# Patient Record
Sex: Male | Born: 1970 | Race: White | Hispanic: No | Marital: Married | Smoking: Current every day smoker
Health system: Southern US, Community
[De-identification: ages and names within clinical notes are randomized; demographics above are authoritative.]

## PROBLEM LIST (undated history)

## (undated) DIAGNOSIS — K219 Gastro-esophageal reflux disease without esophagitis: Secondary | ICD-10-CM

## (undated) DIAGNOSIS — I10 Essential (primary) hypertension: Secondary | ICD-10-CM

## (undated) DIAGNOSIS — T7840XA Allergy, unspecified, initial encounter: Secondary | ICD-10-CM

## (undated) HISTORY — DX: Gastro-esophageal reflux disease without esophagitis: K21.9

## (undated) HISTORY — DX: Allergy, unspecified, initial encounter: T78.40XA

## (undated) HISTORY — PX: ELBOW SURGERY: SHX618

---

## 1996-03-31 HISTORY — PX: HERNIA REPAIR: SHX51

## 2015-08-21 ENCOUNTER — Ambulatory Visit: Payer: Self-pay | Admitting: Family Medicine

## 2015-08-21 ENCOUNTER — Ambulatory Visit
Admission: RE | Admit: 2015-08-21 | Discharge: 2015-08-21 | Disposition: A | Discharge status: Home / Self Care | Payer: 59 | Source: Ambulatory Visit | Attending: Family Medicine | Admitting: Family Medicine

## 2015-08-21 ENCOUNTER — Encounter: Payer: Self-pay | Admitting: Family Medicine

## 2015-08-21 ENCOUNTER — Ambulatory Visit (INDEPENDENT_AMBULATORY_CARE_PROVIDER_SITE_OTHER): Payer: 59 | Admitting: Family Medicine

## 2015-08-21 VITALS — BP 140/102 | HR 104 | Ht 63.0 in | Wt 181.0 lb

## 2015-08-21 DIAGNOSIS — K625 Hemorrhage of anus and rectum: Secondary | ICD-10-CM

## 2015-08-21 DIAGNOSIS — M5090 Cervical disc disorder, unspecified, unspecified cervical region: Secondary | ICD-10-CM

## 2015-08-21 DIAGNOSIS — R030 Elevated blood-pressure reading, without diagnosis of hypertension: Secondary | ICD-10-CM | POA: Diagnosis not present

## 2015-08-21 DIAGNOSIS — M50321 Other cervical disc degeneration at C4-C5 level: Secondary | ICD-10-CM | POA: Diagnosis not present

## 2015-08-21 DIAGNOSIS — M47892 Other spondylosis, cervical region: Secondary | ICD-10-CM | POA: Insufficient documentation

## 2015-08-21 DIAGNOSIS — IMO0001 Reserved for inherently not codable concepts without codable children: Secondary | ICD-10-CM

## 2015-08-21 LAB — HEMOCCULT GUIAC POC 1CARD (OFFICE): FECAL OCCULT BLD: NEGATIVE

## 2015-08-21 MED ORDER — MELOXICAM 15 MG PO TABS: 15.0000 mg | ORAL_TABLET | Freq: Every day | ORAL | Status: DC

## 2015-08-21 MED ORDER — CYCLOBENZAPRINE HCL 10 MG PO TABS: 10.0000 mg | ORAL_TABLET | Freq: Three times a day (TID) | ORAL | Status: DC | PRN

## 2015-08-21 MED ORDER — PREDNISONE 10 MG PO TABS: 10.0000 mg | ORAL_TABLET | Freq: Every day | ORAL | Status: DC

## 2015-08-21 NOTE — Patient Instructions (Signed)

## 2015-08-21 NOTE — Progress Notes (Signed)
Name: Jesse Moody   MRN: [id number]    DOB: 04-Aug-1970   Date:08/21/2015       Progress Note  Subjective  Chief Complaint  Chief Complaint  Patient presents with  . Establish Care  . Neck Pain    neck pain when tries to move in any direction, shoulder pain and elbow pain  . Blood In Stools    visible blood in stools and on toilet paper x 3 weeks    Neck Pain  This is a new problem. The current episode started in the past 7 days. The problem occurs constantly. The problem has been rapidly worsening. The pain is present in the left side. The quality of the pain is described as aching. The pain is at a severity of 5/10. The pain is moderate. The symptoms are aggravated by twisting (rotating neck). Stiffness is present in the morning. Pertinent negatives include no chest pain, fever, headaches, numbness, paresis, tingling, weakness or weight loss. He has tried Public house [employer] ("health medicine pills") for the symptoms. The treatment provided mild relief.  Rectal Bleeding  The current episode started more than 2 weeks ago. The problem occurs occasionally. The problem has been unchanged. The patient is experiencing no pain. Pertinent negatives include no fever, no abdominal pain, no diarrhea, no hematemesis, no hemorrhoids, no nausea, no rectal pain, no hematuria, no chest pain, no headaches, no coughing and no rash.    No problem-specific assessment & plan notes found for this encounter.   Past Medical History  Diagnosis Date  . GERD (gastroesophageal reflux disease)   . Allergy     Past Surgical History  Procedure Laterality Date  . Elbow surgery    . Hernia repair  1998    Family History  Problem Relation Age of Onset  . Cancer Maternal Grandmother     Social History   Social History  . Marital Status: Married    Spouse Name: N/A  . Number of Children: N/A  . Years of Education: N/A   Occupational History  . Not on file.   Social History Main Topics  .  Smoking status: Current Every Day Smoker  . Smokeless tobacco: Not on file  . Alcohol Use: No  . Drug Use: No  . Sexual Activity: Yes   Other Topics Concern  . Not on file   Social History Narrative  . No narrative on file    Allergies  Allergen Reactions  . Penicillins      Review of Systems  Constitutional: Negative for fever, chills, weight loss and malaise/fatigue.  HENT: Negative for ear discharge, ear pain and sore throat.   Eyes: Negative for blurred vision.  Respiratory: Negative for cough, sputum production, shortness of breath and wheezing.   Cardiovascular: Negative for chest pain, palpitations and leg swelling.  Gastrointestinal: Positive for hematochezia. Negative for heartburn, nausea, abdominal pain, diarrhea, constipation, blood in stool, melena, rectal pain, hematemesis and hemorrhoids.  Genitourinary: Negative for dysuria, urgency, frequency and hematuria.  Musculoskeletal: Positive for neck pain. Negative for myalgias, back pain and joint pain.  Skin: Negative for rash.  Neurological: Negative for dizziness, tingling, sensory change, focal weakness, weakness, numbness and headaches.  Endo/Heme/Allergies: Negative for environmental allergies and polydipsia. Does not bruise/bleed easily.  Psychiatric/Behavioral: Negative for depression and suicidal ideas. The patient is not nervous/anxious and does not have insomnia.      Objective  Filed Vitals:   08/21/15 1505  BP: 140/102  Pulse: 104  Height:  (1.6 m)  Weight: 181 lb (82.101 kg)    Physical Exam  Constitutional: He is oriented to person, place, and time and well-developed, well-nourished, and in no distress.  HENT:  Head: Normocephalic.  Right Ear: External ear normal.  Left Ear: External ear normal.  Nose: Nose normal.  Mouth/Throat: Oropharynx is clear and moist.  Eyes: Conjunctivae and EOM are normal. Pupils are equal, round, and reactive to light. Right eye exhibits no discharge. Left  eye exhibits no discharge. No scleral icterus.  Neck: Normal range of motion. Neck supple. No JVD present. No tracheal deviation present. No thyromegaly present.  Cardiovascular: Normal rate, regular rhythm, normal heart sounds and intact distal pulses.  Exam reveals no gallop and no friction rub.   No murmur heard. Pulmonary/Chest: Breath sounds normal. No respiratory distress. He has no wheezes. He has no rales.  Abdominal: Soft. Bowel sounds are normal. He exhibits no mass. There is no hepatosplenomegaly. There is no tenderness. There is no rebound, no guarding and no CVA tenderness.  Genitourinary: Rectum normal and prostate normal. Rectal exam shows no external hemorrhoid, no fissure and no mass. Prostate is not enlarged and not tender.  Musculoskeletal: Normal range of motion. He exhibits no edema.       Cervical back: He exhibits bony tenderness and spasm. He exhibits no tenderness and no swelling.  Lymphadenopathy:    He has no cervical adenopathy.  Neurological: He is alert and oriented to person, place, and time. He has normal sensation, normal strength, normal reflexes and intact cranial nerves. No cranial nerve deficit.  Skin: Skin is warm. No rash noted.  Psychiatric: Mood and affect normal.  Nursing note and vitals reviewed.     Assessment & Plan  Problem List Items Addressed This Visit    None    Visit Diagnoses    Cervical disc disease    -  Primary    Relevant Medications    meloxicam (MOBIC) 15 MG tablet    cyclobenzaprine (FLEXERIL) 10 MG tablet    predniSONE (DELTASONE) 10 MG tablet    Other Relevant Orders    DG Cervical Spine Complete    Renal Function Panel    Rectal bleed        Relevant Orders    POCT Occult Blood Stool (Completed)    Hemoglobin    Elevated blood pressure             Dr. Hayden Rasmusseneanna Jones [facility]  08/21/2015

## 2015-08-22 LAB — RENAL FUNCTION PANEL
Albumin: 4.7 g/dL (ref 3.5–5.5)
BUN / CREAT RATIO: 14 (ref 9–20)
BUN: 15 mg/dL (ref 6–24)
CALCIUM: 9.8 mg/dL (ref 8.7–10.2)
CO2: 25 mmol/L (ref 18–29)
CREATININE: 1.04 mg/dL (ref 0.76–1.27)
Chloride: 102 mmol/L (ref 96–106)
GFR, EST AFRICAN AMERICAN: 100 mL/min/{1.73_m2} (ref >59)
GFR, EST NON AFRICAN AMERICAN: 86 mL/min/{1.73_m2} (ref >59)
Glucose: 65 mg/dL (ref 65–99)
Phosphorus: 3.6 mg/dL (ref 2.5–4.5)
Potassium: 4.4 mmol/L (ref 3.5–5.2)
SODIUM: 144 mmol/L (ref 134–144)

## 2015-08-22 LAB — HEMOGLOBIN: HEMOGLOBIN: 16 g/dL (ref 12.6–17.7)

## 2015-09-04 ENCOUNTER — Ambulatory Visit: Payer: 59 | Admitting: Family Medicine

## 2015-09-07 ENCOUNTER — Ambulatory Visit (INDEPENDENT_AMBULATORY_CARE_PROVIDER_SITE_OTHER): Payer: 59 | Admitting: Family Medicine

## 2015-09-07 ENCOUNTER — Encounter: Payer: Self-pay | Admitting: Family Medicine

## 2015-09-07 VITALS — BP 150/110 | HR 80 | Ht 63.0 in | Wt 179.0 lb

## 2015-09-07 DIAGNOSIS — M5090 Cervical disc disorder, unspecified, unspecified cervical region: Secondary | ICD-10-CM

## 2015-09-07 DIAGNOSIS — M5030 Other cervical disc degeneration, unspecified cervical region: Secondary | ICD-10-CM

## 2015-09-07 DIAGNOSIS — I10 Essential (primary) hypertension: Secondary | ICD-10-CM

## 2015-09-07 MED ORDER — MELOXICAM 15 MG PO TABS: 15.0000 mg | ORAL_TABLET | Freq: Every day | ORAL | Status: DC

## 2015-09-07 MED ORDER — TRAMADOL HCL 50 MG PO TABS: 50.0000 mg | ORAL_TABLET | Freq: Three times a day (TID) | ORAL | Status: DC | PRN

## 2015-09-07 MED ORDER — LISINOPRIL-HYDROCHLOROTHIAZIDE 10-12.5 MG PO TABS: 1.0000 | ORAL_TABLET | Freq: Every day | ORAL | Status: DC

## 2015-09-07 MED ORDER — CYCLOBENZAPRINE HCL 10 MG PO TABS: 10.0000 mg | ORAL_TABLET | Freq: Three times a day (TID) | ORAL | Status: DC | PRN

## 2015-09-07 NOTE — Patient Instructions (Signed)

## 2015-09-07 NOTE — Progress Notes (Signed)
Name: Jesse Moody   MRN: [id number]    DOB: 01-Jul-1970   Date:09/07/2015       Progress Note  Subjective  Chief Complaint  Chief Complaint  Patient presents with  . Follow-up    neck pain- taking prednisone and meloxicam- seems to "help some"    Neck Pain  This is a new problem. The current episode started more than 1 month ago. The problem occurs daily. The problem has been gradually improving. The pain is associated with nothing. The pain is present in the left side. The quality of the pain is described as aching. The pain is at a severity of 7/10. The pain is moderate. The pain is same all the time. Pertinent negatives include no chest pain, fever, headaches, leg pain, numbness, pain with swallowing, paresis, photophobia, tingling, weakness or weight loss.  Hypertension This is a new problem. The current episode started more than 1 month ago. The problem is unchanged. The problem is uncontrolled. Associated symptoms include neck pain. Pertinent negatives include no blurred vision, chest pain, headaches, malaise/fatigue, palpitations or shortness of breath. Past treatments include nothing. The current treatment provides moderate improvement. There is no history of angina, kidney disease, CAD/MI, CVA, heart failure, left ventricular hypertrophy, PVD, renovascular disease or retinopathy. There is no history of chronic renal disease or a hypertension causing med.    No problem-specific assessment & plan notes found for this encounter.   Past Medical History  Diagnosis Date  . GERD (gastroesophageal reflux disease)   . Allergy     Past Surgical History  Procedure Laterality Date  . Elbow surgery    . Hernia repair  1998    Family History  Problem Relation Age of Onset  . Cancer Maternal Grandmother     Social History   Social History  . Marital Status: Married    Spouse Name: N/A  . Number of Children: N/A  . Years of Education: N/A   Occupational History  . Not on file.    Social History Main Topics  . Smoking status: Current Every Day Smoker  . Smokeless tobacco: Not on file  . Alcohol Use: No  . Drug Use: No  . Sexual Activity: Yes   Other Topics Concern  . Not on file   Social History Narrative    Allergies  Allergen Reactions  . Penicillins      Review of Systems  Constitutional: Negative for fever, chills, weight loss and malaise/fatigue.  HENT: Negative for ear discharge, ear pain and sore throat.   Eyes: Negative for blurred vision and photophobia.  Respiratory: Negative for cough, sputum production, shortness of breath and wheezing.   Cardiovascular: Negative for chest pain, palpitations and leg swelling.  Gastrointestinal: Negative for heartburn, nausea, abdominal pain, diarrhea, constipation, blood in stool and melena.  Genitourinary: Negative for dysuria, urgency, frequency and hematuria.  Musculoskeletal: Positive for neck pain. Negative for myalgias, back pain and joint pain.  Skin: Negative for rash.  Neurological: Negative for dizziness, tingling, sensory change, focal weakness, weakness, numbness and headaches.  Endo/Heme/Allergies: Negative for environmental allergies and polydipsia. Does not bruise/bleed easily.  Psychiatric/Behavioral: Negative for depression and suicidal ideas. The patient is not nervous/anxious and does not have insomnia.      Objective  Filed Vitals:   09/07/15 1514  BP: 150/110  Pulse: 80  Height:  (1.6 m)  Weight: 179 lb (81.194 kg)    Physical Exam  Constitutional: He is oriented to person, place, and time and  well-developed, well-nourished, and in no distress.  HENT:  Head: Normocephalic.  Right Ear: External ear normal.  Left Ear: External ear normal.  Nose: Nose normal.  Mouth/Throat: Oropharynx is clear and moist.  Eyes: Conjunctivae and EOM are normal. Pupils are equal, round, and reactive to light. Right eye exhibits no discharge. Left eye exhibits no discharge. No scleral  icterus.  Neck: Normal range of motion. Neck supple. No JVD present. No tracheal deviation present. No thyromegaly present.  Cardiovascular: Normal rate, regular rhythm, normal heart sounds and intact distal pulses.  Exam reveals no gallop and no friction rub.   No murmur heard. Pulmonary/Chest: Breath sounds normal. No respiratory distress. He has no wheezes. He has no rales.  Abdominal: Soft. Bowel sounds are normal. He exhibits no mass. There is no hepatosplenomegaly. There is no tenderness. There is no rebound, no guarding and no CVA tenderness.  Musculoskeletal: Normal range of motion. He exhibits no edema or tenderness.  Lymphadenopathy:    He has no cervical adenopathy.  Neurological: He is alert and oriented to person, place, and time. He has normal sensation, normal strength, normal reflexes and intact cranial nerves. No cranial nerve deficit.  Skin: Skin is warm. No rash noted.  Psychiatric: Mood and affect normal.  Nursing note and vitals reviewed.     Assessment & Plan  Problem List Items Addressed This Visit      Cardiovascular and Mediastinum   Essential hypertension   Relevant Medications   lisinopril-hydrochlorothiazide (PRINZIDE,ZESTORETIC) 10-12.5 MG tablet     Musculoskeletal and Integument   Degenerative cervical disc - Primary   Relevant Medications   traMADol (ULTRAM) 50 MG tablet   cyclobenzaprine (FLEXERIL) 10 MG tablet   meloxicam (MOBIC) 15 MG tablet    Other Visit Diagnoses    Cervical disc disease        Relevant Medications    traMADol (ULTRAM) 50 MG tablet    cyclobenzaprine (FLEXERIL) 10 MG tablet    meloxicam (MOBIC) 15 MG tablet         Dr. Hayden Rasmusseneanna Coreen [facility]  09/07/2015

## 2015-10-05 ENCOUNTER — Encounter: Payer: Self-pay | Admitting: Family Medicine

## 2015-10-05 ENCOUNTER — Other Ambulatory Visit
Admission: RE | Admit: 2015-10-05 | Discharge: 2015-10-05 | Disposition: A | Discharge status: Home / Self Care | Payer: 59 | Source: Ambulatory Visit | Attending: Family Medicine | Admitting: Family Medicine

## 2015-10-05 ENCOUNTER — Ambulatory Visit (INDEPENDENT_AMBULATORY_CARE_PROVIDER_SITE_OTHER): Payer: 59 | Admitting: Family Medicine

## 2015-10-05 VITALS — BP 130/88 | HR 84 | Ht 63.0 in | Wt 181.0 lb

## 2015-10-05 DIAGNOSIS — I10 Essential (primary) hypertension: Secondary | ICD-10-CM

## 2015-10-05 DIAGNOSIS — M5030 Other cervical disc degeneration, unspecified cervical region: Secondary | ICD-10-CM

## 2015-10-05 DIAGNOSIS — M5090 Cervical disc disorder, unspecified, unspecified cervical region: Secondary | ICD-10-CM

## 2015-10-05 LAB — BASIC METABOLIC PANEL
Anion gap: 6 (ref 5–15)
BUN: 22 mg/dL — ABNORMAL HIGH (ref 6–20)
CALCIUM: 9.7 mg/dL (ref 8.9–10.3)
CO2: 29 mmol/L (ref 22–32)
Chloride: 100 mmol/L — ABNORMAL LOW (ref 101–111)
Creatinine, Ser: 1.11 mg/dL (ref 0.61–1.24)
Glucose, Bld: 91 mg/dL (ref 65–99)
Potassium: 4.3 mmol/L (ref 3.5–5.1)
SODIUM: 135 mmol/L (ref 135–145)

## 2015-10-05 MED ORDER — CYCLOBENZAPRINE HCL 10 MG PO TABS: 10.0000 mg | ORAL_TABLET | Freq: Every day | ORAL | Status: DC

## 2015-10-05 MED ORDER — TRAMADOL HCL 50 MG PO TABS: 50.0000 mg | ORAL_TABLET | Freq: Two times a day (BID) | ORAL | Status: DC

## 2015-10-05 MED ORDER — LISINOPRIL-HYDROCHLOROTHIAZIDE 10-12.5 MG PO TABS: 1.0000 | ORAL_TABLET | Freq: Every day | ORAL | Status: DC

## 2015-10-05 NOTE — Progress Notes (Signed)
Name: Jesse Moody   MRN: [id number]    DOB: 05/12/1970   Date:10/05/2015       Progress Note  Subjective  Chief Complaint  Chief Complaint  Patient presents with  . Neck Pain    referral to ortho to discuss injection    Neck Pain  This is a chronic problem. The current episode started more than 1 year ago. The problem occurs constantly. The problem has been waxing and waning. The pain is associated with nothing. The pain is present in the left side. The pain is at a severity of 5/10. The pain is moderate. Pertinent negatives include no chest pain, fever, headaches, numbness, paresis, tingling, weakness or weight loss. He has tried oral narcotics for the symptoms. The treatment provided moderate relief.    No problem-specific assessment & plan notes found for this encounter.   Past Medical History  Diagnosis Date  . GERD (gastroesophageal reflux disease)   . Allergy     Past Surgical History  Procedure Laterality Date  . Elbow surgery    . Hernia repair  1998    Family History  Problem Relation Age of Onset  . Cancer Maternal Grandmother     Social History   Social History  . Marital Status: Married    Spouse Name: N/A  . Number of Children: N/A  . Years of Education: N/A   Occupational History  . Not on file.   Social History Main Topics  . Smoking status: Current Every Day Smoker  . Smokeless tobacco: Not on file  . Alcohol Use: No  . Drug Use: No  . Sexual Activity: Yes   Other Topics Concern  . Not on file   Social History Narrative    Allergies  Allergen Reactions  . Penicillins      Review of Systems  Constitutional: Negative for fever, chills, weight loss and malaise/fatigue.  HENT: Negative for ear discharge, ear pain and sore throat.   Eyes: Negative for blurred vision.  Respiratory: Negative for cough, sputum production, shortness of breath and wheezing.   Cardiovascular: Negative for chest pain, palpitations and leg swelling.   Gastrointestinal: Negative for heartburn, nausea, abdominal pain, diarrhea, constipation, blood in stool and melena.  Genitourinary: Negative for dysuria, urgency, frequency and hematuria.  Musculoskeletal: Positive for neck pain. Negative for myalgias, back pain and joint pain.  Skin: Negative for rash.  Neurological: Negative for dizziness, tingling, sensory change, focal weakness, weakness, numbness and headaches.  Endo/Heme/Allergies: Negative for environmental allergies and polydipsia. Does not bruise/bleed easily.  Psychiatric/Behavioral: Negative for depression and suicidal ideas. The patient is not nervous/anxious and does not have insomnia.      Objective  Filed Vitals:   10/05/15 1535  BP: 130/88  Pulse: 84  Height: 5\' 3"  (1.6 m)  Weight: 181 lb (82.101 kg)    Physical Exam  Constitutional: He is oriented to person, place, and time and well-developed, well-nourished, and in no distress.  HENT:  Head: Normocephalic.  Right Ear: External ear normal.  Left Ear: External ear normal.  Nose: Nose normal.  Mouth/Throat: Oropharynx is clear and moist.  Eyes: Conjunctivae and EOM are normal. Pupils are equal, round, and reactive to light. Right eye exhibits no discharge. Left eye exhibits no discharge. No scleral icterus.  Neck: Normal range of motion. Neck supple. No JVD present. No tracheal deviation present. No thyromegaly present.  Cardiovascular: Normal rate, regular rhythm, normal heart sounds and intact distal pulses.  Exam reveals no gallop and no  friction rub.   No murmur heard. Pulmonary/Chest: Breath sounds normal. No respiratory distress. He has no wheezes. He has no rales.  Abdominal: Soft. Bowel sounds are normal. He exhibits no mass. There is no hepatosplenomegaly. There is no tenderness. There is no rebound, no guarding and no CVA tenderness.  Musculoskeletal: Normal range of motion. He exhibits no edema or tenderness.       Cervical back: He exhibits spasm.   Lymphadenopathy:    He has no cervical adenopathy.  Neurological: He is alert and oriented to person, place, and time. He has normal sensation, normal strength and normal reflexes. No cranial nerve deficit.  Skin: Skin is warm. No rash noted.  Psychiatric: Mood and affect normal.  Nursing note and vitals reviewed.     Assessment & Plan  Problem List Items Addressed This Visit      Cardiovascular and Mediastinum   Essential hypertension - Primary   Relevant Medications   lisinopril-hydrochlorothiazide (PRINZIDE,ZESTORETIC) 10-12.5 MG tablet   Other Relevant Orders   Basic metabolic panel     Musculoskeletal and Integument   Degenerative cervical disc   Relevant Medications   traMADol (ULTRAM) 50 MG tablet   cyclobenzaprine (FLEXERIL) 10 MG tablet   Other Relevant Orders   Ambulatory referral to Orthopedic Surgery    Other Visit Diagnoses    Cervical disc disease        Relevant Medications    traMADol (ULTRAM) 50 MG tablet    cyclobenzaprine (FLEXERIL) 10 MG tablet    Other Relevant Orders    Ambulatory referral to Orthopedic Surgery         Dr. Elizabeth Sauereanna Kasee [facility]antz [facility]  10/05/2015

## 2015-11-26 ENCOUNTER — Other Ambulatory Visit: Payer: Self-pay | Admitting: Family Medicine

## 2015-11-26 DIAGNOSIS — M5090 Cervical disc disorder, unspecified, unspecified cervical region: Secondary | ICD-10-CM

## 2016-04-11 ENCOUNTER — Encounter: Payer: Self-pay | Admitting: Family Medicine

## 2016-04-11 ENCOUNTER — Ambulatory Visit (INDEPENDENT_AMBULATORY_CARE_PROVIDER_SITE_OTHER): Payer: 59 | Admitting: Family Medicine

## 2016-04-11 VITALS — BP 120/82 | HR 80 | Ht 63.0 in | Wt 185.0 lb

## 2016-04-11 DIAGNOSIS — M5030 Other cervical disc degeneration, unspecified cervical region: Secondary | ICD-10-CM

## 2016-04-11 DIAGNOSIS — I10 Essential (primary) hypertension: Secondary | ICD-10-CM

## 2016-04-11 DIAGNOSIS — Z23 Encounter for immunization: Secondary | ICD-10-CM | POA: Diagnosis not present

## 2016-04-11 MED ORDER — LISINOPRIL-HYDROCHLOROTHIAZIDE 10-12.5 MG PO TABS: 1.0000 | ORAL_TABLET | Freq: Every day | ORAL | 3 refills | Status: DC

## 2016-04-11 NOTE — Progress Notes (Signed)
Name: Jesse Moody   MRN: [id number]    DOB: 27-Jan-1971   Date:04/11/2016       Progress Note  Subjective  Chief Complaint  Chief Complaint  Patient presents with  . Hypertension    Hypertension  This is a chronic problem. The current episode started more than 1 year ago. The problem has been gradually improving since onset. The problem is controlled. Pertinent negatives include no anxiety, blurred vision, chest pain, headaches, malaise/fatigue, neck pain, orthopnea, palpitations, peripheral edema, PND, shortness of breath or sweats. There are no associated agents to hypertension. There are no known risk factors for coronary artery disease. Past treatments include ACE inhibitors and diuretics. The current treatment provides moderate improvement. There are no compliance problems.  There is no history of angina, kidney disease, CAD/MI, CVA, heart failure, left ventricular hypertrophy, PVD, renovascular disease or retinopathy. There is no history of chronic renal disease or a hypertension causing med.    No problem-specific Assessment & Plan notes found for this encounter.   Past Medical History:  Diagnosis Date  . Allergy   . GERD (gastroesophageal reflux disease)     Past Surgical History:  Procedure Laterality Date  . ELBOW SURGERY    . HERNIA REPAIR  1998    Family History  Problem Relation Age of Onset  . Cancer Maternal Grandmother     Social History   Social History  . Marital status: Married    Spouse name: N/A  . Number of children: N/A  . Years of education: N/A   Occupational History  . Not on file.   Social History Main Topics  . Smoking status: Current Every Day Smoker  . Smokeless tobacco: Not on file  . Alcohol use No  . Drug use: No  . Sexual activity: Yes   Other Topics Concern  . Not on file   Social History Narrative  . No narrative on file    Allergies  Allergen Reactions  . Penicillins      Review of Systems  Constitutional: Negative  for chills, fever, malaise/fatigue and weight loss.  HENT: Negative for ear discharge, ear pain and sore throat.   Eyes: Negative for blurred vision.  Respiratory: Negative for cough, sputum production, shortness of breath and wheezing.   Cardiovascular: Negative for chest pain, palpitations, orthopnea, leg swelling and PND.  Gastrointestinal: Negative for abdominal pain, blood in stool, constipation, diarrhea, heartburn, melena and nausea.  Genitourinary: Negative for dysuria, frequency, hematuria and urgency.  Musculoskeletal: Negative for back pain, joint pain, myalgias and neck pain.  Skin: Negative for rash.  Neurological: Negative for dizziness, tingling, sensory change, focal weakness and headaches.  Endo/Heme/Allergies: Negative for environmental allergies and polydipsia. Does not bruise/bleed easily.  Psychiatric/Behavioral: Negative for depression and suicidal ideas. The patient is not nervous/anxious and does not have insomnia.      Objective  Vitals:   04/11/16 1533  BP: 120/82  Pulse: 80  Weight: 185 lb (83.9 kg)  Height: 5\' 3"  (1.6 m)    Physical Exam  Constitutional: He is oriented to person, place, and time and well-developed, well-nourished, and in no distress.  HENT:  Head: Normocephalic.  Right Ear: External ear normal.  Left Ear: External ear normal.  Nose: Nose normal.  Mouth/Throat: Oropharynx is clear and moist.  Eyes: Conjunctivae and EOM are normal. Pupils are equal, round, and reactive to light. Right eye exhibits no discharge. Left eye exhibits no discharge. No scleral icterus.  Neck: Normal range of motion. Neck  supple. No JVD present. No tracheal deviation present. No thyromegaly present.  Cardiovascular: Normal rate, regular rhythm, normal heart sounds and intact distal pulses.  Exam reveals no gallop and no friction rub.   No murmur heard. Pulmonary/Chest: Breath sounds normal. No respiratory distress. He has no wheezes. He has no rales.   Abdominal: Soft. Bowel sounds are normal. He exhibits no mass. There is no hepatosplenomegaly. There is no tenderness. There is no rebound, no guarding and no CVA tenderness.  Musculoskeletal: Normal range of motion. He exhibits no edema or tenderness.  Lymphadenopathy:    He has no cervical adenopathy.  Neurological: He is alert and oriented to person, place, and time. He has normal sensation, normal strength, normal reflexes and intact cranial nerves. No cranial nerve deficit.  Skin: Skin is warm. No rash noted.  Psychiatric: Mood and affect normal.  Nursing note and vitals reviewed.     Assessment & Plan  Problem List Items Addressed This Visit      Cardiovascular and Mediastinum   Essential hypertension - Primary   Relevant Medications   lisinopril-hydrochlorothiazide (PRINZIDE,ZESTORETIC) 10-12.5 MG tablet     Musculoskeletal and Integument   Degenerative cervical disc   Relevant Orders   Ambulatory referral to Orthopedic Surgery    Other Visit Diagnoses    Immunization due       Relevant Orders   Flu Vaccine QUAD 36+ mos PF IM (Fluarix & Fluzone Quad PF) (Completed)    called ortho and left message that pt wants to come back to discuss further treatment/ injection vs Dr Yves Dill    Dr. Hayden [facility]  04/11/16

## 2016-04-15 ENCOUNTER — Other Ambulatory Visit: Payer: Self-pay

## 2016-04-18 ENCOUNTER — Ambulatory Visit
Admission: EM | Admit: 2016-04-18 | Discharge: 2016-04-18 | Disposition: A | Discharge status: Home / Self Care | Payer: 59 | Attending: Family Medicine | Admitting: Family Medicine

## 2016-04-18 ENCOUNTER — Encounter: Payer: Self-pay | Admitting: *Deleted

## 2016-04-18 DIAGNOSIS — Z024 Encounter for examination for driving license: Secondary | ICD-10-CM

## 2016-04-18 HISTORY — DX: Essential (primary) hypertension: I10

## 2016-04-18 LAB — DEPT OF TRANSP DIPSTICK, URINE (ARMC ONLY)
BILIRUBIN URINE: NEGATIVE
Glucose, UA: NEGATIVE mg/dL
Ketones, ur: NEGATIVE mg/dL
LEUKOCYTES UA: NEGATIVE
NITRITE: NEGATIVE
PH: 6.5 (ref 5.0–8.0)
PROTEIN: NEGATIVE mg/dL
SPECIFIC GRAVITY, URINE: 1.025 (ref 1.005–1.030)

## 2016-04-18 NOTE — ED Triage Notes (Signed)
Commercial Drivers License physical exam 

## 2016-04-18 NOTE — ED Provider Notes (Signed)
CSN: [id number]     Arrival date & time 04/18/16  1635 History   First MD Initiated Contact with Patient 04/18/16 1806     Chief Complaint  Patient presents with  . Commercial Driver's License Exam   (Consider location/radiation/quality/duration/timing/severity/associated sxs/prior Treatment) HPI  He presents for a DOT physical. He has a history of hypertension currently taking lisinopril hydrochlorothiazide. Past surgical history includes an elbow arthroscopy and herniorrhaphy       Past Medical History:  Diagnosis Date  . Allergy   . GERD (gastroesophageal reflux disease)   . Hypertension    Past Surgical History:  Procedure Laterality Date  . ELBOW SURGERY    . HERNIA REPAIR  1998   Family History  Problem Relation Age of Onset  . Cancer Maternal Grandmother    Social History  Substance Use Topics  . Smoking status: Current Every Day Smoker  . Smokeless tobacco: Former NeurosurgeonUser  . Alcohol use No    Review of Systems  All other systems reviewed and are negative.   Allergies  Penicillins  Home Medications   Prior to Admission medications   Medication Sig Start Date End Date Taking? Authorizing Provider  acetaminophen (TYLENOL) 325 MG tablet Take 650 mg by mouth every 6 (six) hours as needed.   Yes Historical Provider, MD  Famotidine-Ca Carb-Mag Hydrox (ACID REDUCER + ANTACID PO) Take 1 capsule by mouth 2 (two) times daily.   Yes Historical Provider, MD  lisinopril-hydrochlorothiazide (PRINZIDE,ZESTORETIC) 10-12.5 MG tablet Take 1 tablet by mouth daily. 04/11/16  Yes Duanne Limerickeanna C Jones, MD   Meds Ordered and Administered this Visit  Medications - No data to display  BP (!) 146/96 (BP Location: Left Arm)   Pulse 91   Temp 98.2 F (36.8 C) (Oral)   Resp 16   Ht 5\' 6"  (1.676 m)   Wt 186 lb (84.4 kg)   BMI 30.02 kg/m  No data found.   Physical Exam  Constitutional:  Referred to DOT physical sheet  Nursing note and vitals reviewed.   Urgent Care Course      Procedures (including critical care time)  Labs Review Labs Reviewed  DEPT OF TRANSP DIPSTICK, URINE([facility] ONLY) - Abnormal; Notable for the following:       Result Value   Hgb urine dipstick TRACE (*)    All other components within normal limits    Imaging Review No results found.   Visual Acuity Review  Right Eye Distance:   Left Eye Distance:   Bilateral Distance:    Right Eye Near:   Left Eye Near:    Bilateral Near:         MDM   1. Driver's permit PE (physical examination)    Patient's blood pressure was not acceptable on 2 separate occasions. I will issue a 3 month certificate that will allow him to have his primary care physician correct his blood pressure to the acceptable levels. If it is not within except the levels at that time then he will fail until his pressure falls below 140/90.  If His pressure does normalize then his certificate will be from today's date and not the time that he returns    Lutricia FeilWilliam P Sonam Huelsmann, PA-C 04/18/16 1851

## 2016-05-12 ENCOUNTER — Other Ambulatory Visit: Payer: Self-pay

## 2016-05-16 ENCOUNTER — Ambulatory Visit: Payer: 59 | Admitting: Family Medicine

## 2016-05-23 ENCOUNTER — Ambulatory Visit: Payer: 59 | Admitting: Family Medicine

## 2016-05-29 ENCOUNTER — Encounter: Payer: Self-pay | Admitting: Family Medicine

## 2016-05-29 ENCOUNTER — Ambulatory Visit (INDEPENDENT_AMBULATORY_CARE_PROVIDER_SITE_OTHER): Payer: 59 | Admitting: Family Medicine

## 2016-05-29 VITALS — BP 120/62 | HR 88 | Ht 66.0 in | Wt 179.0 lb

## 2016-05-29 DIAGNOSIS — I10 Essential (primary) hypertension: Secondary | ICD-10-CM | POA: Diagnosis not present

## 2016-05-29 MED ORDER — LISINOPRIL-HYDROCHLOROTHIAZIDE 20-25 MG PO TABS: 1.0000 | ORAL_TABLET | Freq: Every day | ORAL | 1 refills | Status: DC

## 2016-05-29 NOTE — Progress Notes (Signed)
Name: Jesse Moody   MRN: [id number]    DOB: [birth year]   Date:05/29/2016       Progress Note  Subjective  Chief Complaint  Chief Complaint  Patient presents with  . Hypertension    taking 20/25mg  x 10 days- needs a refill called in for that med    Hypertension  This is a new problem. The current episode started more than 1 month ago. The problem has been rapidly improving since onset. The problem is controlled. Pertinent negatives include no anxiety, blurred vision, chest pain, headaches, malaise/fatigue, neck pain, orthopnea, palpitations, peripheral edema, PND, shortness of breath or sweats. There are no associated agents to hypertension. There are no known risk factors for coronary artery disease. Past treatments include ACE inhibitors and diuretics. The current treatment provides moderate improvement. There are no compliance problems.  There is no history of angina, kidney disease, CAD/MI, CVA, heart failure, left ventricular hypertrophy, PVD, renovascular disease or retinopathy. There is no history of chronic renal disease or a hypertension causing med.    No problem-specific Assessment & Plan notes found for this encounter.   Past Medical History:  Diagnosis Date  . Allergy   . GERD (gastroesophageal reflux disease)   . Hypertension     Past Surgical History:  Procedure Laterality Date  . ELBOW SURGERY    . HERNIA REPAIR  1998    Family History  Problem Relation Age of Onset  . Cancer Maternal Grandmother     Social History   Social History  . Marital status: Married    Spouse name: N/A  . Number of children: N/A  . Years of education: N/A   Occupational History  . Not on file.   Social History Main Topics  . Smoking status: Current Every Day Smoker  . Smokeless tobacco: Former NeurosurgeonUser  . Alcohol use No  . Drug use: No  . Sexual activity: Yes   Other Topics Concern  . Not on file   Social History Narrative  . No narrative on file    Allergies  Allergen  Reactions  . Penicillins Hives    Outpatient Medications Prior to Visit  Medication Sig Dispense Refill  . acetaminophen (TYLENOL) 325 MG tablet Take 650 mg by mouth every 6 (six) hours as needed.    . Famotidine-Ca Carb-Mag Hydrox (ACID REDUCER + ANTACID PO) Take 1 capsule by mouth 2 (two) times daily.    Marland Kitchen. lisinopril-hydrochlorothiazide (PRINZIDE,ZESTORETIC) 10-12.5 MG tablet Take 1 tablet by mouth daily. (Patient taking differently: Take 2 tablets by mouth daily. ) 90 tablet 3   No facility-administered medications prior to visit.     Review of Systems  Constitutional: Negative for chills, fever, malaise/fatigue and weight loss.  HENT: Negative for ear discharge, ear pain and sore throat.   Eyes: Negative for blurred vision.  Respiratory: Negative for cough, sputum production, shortness of breath and wheezing.   Cardiovascular: Negative for chest pain, palpitations, orthopnea, leg swelling and PND.  Gastrointestinal: Negative for abdominal pain, blood in stool, constipation, diarrhea, heartburn, melena and nausea.  Genitourinary: Negative for dysuria, frequency, hematuria and urgency.  Musculoskeletal: Negative for back pain, joint pain, myalgias and neck pain.  Skin: Negative for rash.  Neurological: Negative for dizziness, tingling, sensory change, focal weakness and headaches.  Endo/Heme/Allergies: Negative for environmental allergies and polydipsia. Does not bruise/bleed easily.  Psychiatric/Behavioral: Negative for depression and suicidal ideas. The patient is not nervous/anxious and does not have insomnia.      Objective  Vitals:  05/29/16 1534  BP: 120/62  Pulse: 88  Weight: 179 lb (81.2 kg)  Height: 5\' 6"  (1.676 m)    Physical Exam  Constitutional: He is oriented to person, place, and time and well-developed, well-nourished, and in no distress.  HENT:  Head: Normocephalic.  Right Ear: External ear normal.  Left Ear: External ear normal.  Nose: Nose normal.   Mouth/Throat: Oropharynx is clear and moist.  Eyes: Conjunctivae and EOM are normal. Pupils are equal, round, and reactive to light. Right eye exhibits no discharge. Left eye exhibits no discharge. No scleral icterus.  Neck: Normal range of motion. Neck supple. No JVD present. No tracheal deviation present. No thyromegaly present.  Cardiovascular: Normal rate, regular rhythm, normal heart sounds and intact distal pulses.  Exam reveals no gallop and no friction rub.   No murmur heard. Pulmonary/Chest: Breath sounds normal. No respiratory distress. He has no wheezes. He has no rales.  Abdominal: Soft. Bowel sounds are normal. He exhibits no distension and no mass. There is no hepatosplenomegaly. There is no tenderness. There is no rebound, no guarding and no CVA tenderness.  Musculoskeletal: Normal range of motion. He exhibits no edema or tenderness.  Lymphadenopathy:    He has no cervical adenopathy.  Neurological: He is alert and oriented to person, place, and time. He has normal sensation, normal strength, normal reflexes and intact cranial nerves. No cranial nerve deficit.  Skin: Skin is warm. No rash noted.  Psychiatric: Mood and affect normal.  Nursing note and vitals reviewed.     Assessment & Plan  Problem List Items Addressed This Visit      Cardiovascular and Mediastinum   Essential hypertension - Primary   Relevant Medications   lisinopril-hydrochlorothiazide (PRINZIDE,ZESTORETIC) 20-25 MG tablet      Meds ordered this encounter  Medications  . lisinopril-hydrochlorothiazide (PRINZIDE,ZESTORETIC) 20-25 MG tablet    Sig: Take 1 tablet by mouth daily.    Dispense:  90 tablet    Refill:  1      Dr. Elizabeth Sauer [facility]  05/29/16

## 2016-12-04 ENCOUNTER — Ambulatory Visit (INDEPENDENT_AMBULATORY_CARE_PROVIDER_SITE_OTHER): Payer: BLUE CROSS/BLUE SHIELD | Admitting: Family Medicine

## 2016-12-04 ENCOUNTER — Encounter: Payer: Self-pay | Admitting: Family Medicine

## 2016-12-04 VITALS — BP 118/70 | HR 72 | Ht 66.0 in | Wt 180.0 lb

## 2016-12-04 DIAGNOSIS — Z23 Encounter for immunization: Secondary | ICD-10-CM | POA: Diagnosis not present

## 2016-12-04 DIAGNOSIS — I10 Essential (primary) hypertension: Secondary | ICD-10-CM | POA: Diagnosis not present

## 2016-12-04 MED ORDER — LISINOPRIL-HYDROCHLOROTHIAZIDE 20-25 MG PO TABS: 1.0000 | ORAL_TABLET | Freq: Every day | ORAL | 1 refills | Status: DC

## 2016-12-04 NOTE — Patient Instructions (Signed)
The DASH Diet  Dietary Approaches to Stop Hypertension   What is hypertension?  Hypertension is the term for blood pressure that is consistently higher than normal. Blood pressure is the force of blood against artery walls. Blood pressure can be unhealthy if it is above 120/80. The higher your blood pressure, the greater the health risk. High blood pressure can be controlled if you take these steps:  . Maintain a healthy weight.  . Be physically active.  . Follow a healthy eating plan, which includes foods lower in salt and sodium.  . If you drink alcoholic beverages, do so in moderation.  As noted in this list, diet affects high blood pressure. Following the DASH diet and reducing the amount of sodium in your diet will help lower your blood pressure. It will also help prevent high blood pressure.   What is the DASH diet?  Dietary Approaches to Stop Hypertension (DASH) is a diet that is low in saturated fat, cholesterol, and total fat. It emphasizes fruits, vegetables, and low-fat dairy foods. The DASH diet also includes whole-grain products, fish, poultry, and nuts. It encourages fewer servings of red meat, sweets, and sugar-containing beverages. It is rich in magnesium, potassium, and calcium, as well as protein and fiber.   How do I get started on the DASH diet?  The DASH diet requires no special foods and has no hard-to-follow recipes. Start by seeing how DASH compares with your current eating habits. The DASH eating plan shown is based on 2,000 calories a day.   Your health care provider or a dietitian can help you determine how many calories a day you need. Most adults need somewhere between 1600 and 2800 calories a day. Serving sizes will vary between 1/2 cup and 1 1/4 cups. Check the product's nutrition label to determine serving sizes of particular products.  Type of Food Servings for Diet of 2000 Calories/Day  Grains/Grain products (include at least 3 whole grain foods each day) 7-8   Fruits 4-5  Vegetables 4-5  Low fat or non-fat dairy foods 2-3  Lean meats, fish, poultry 2 or less  Nuts, seeds, and legumes 4-5/week  Fats and sweets Limited   Make changes gradually.  Here are some suggestions that might help:  . If you now eat 1 or 2 servings of vegetables a day, add a serving at lunch and another at dinner.  . If you don't eat fruit now or have only juice at breakfast, add a serving to your meals or have it as a snack.  . Drink milk or water with lunch or dinner instead of soda, sugar sweetened tea, or alcohol. Choose low-fat (1%) or fat-free (skim) dairy products to reduce how much saturated fat, total fat, cholesterol, and calories you eat.  . If you have trouble digesting dairy products, try taking lactase enzyme pills or drops (available at drugstores and groceries) with the dairy foods. Or buy lactose-free milk or milk with lactase enzyme added to it.  . Read food labels on margarines and salad dressings to choose products lowest in fat.  . If you now eat large portions of meat, cut back gradually--by a half or a third at each meal. Limit meat to 6 ounces a day (2 servings). Three to four ounces is about the size of a deck of cards.  . Have 2 or more vegetarian-style (meatless) meals each week.   Increase servings of vegetables, rice, pasta, and beans in all meals.  Try casseroles and pasta, and   stir-fry dishes, which have less meat and more vegetables, grains, and beans.  . Use fruits canned in their own juice. Fresh fruits require little or no preparation. Dried fruits are a good choice to carry with you or to have ready in the car.  . Try these snacks ideas: unsalted pretzels or nuts mixed with raisins, graham crackers, low-fat and fat-free yogurt and frozen yogurt, popcorn with no salt or butter added, and raw vegetables.  . Choose whole grain foods to get more nutrients, including minerals and fiber. For example, choose whole-wheat bread or whole-grain cereals.   . Use fresh, frozen, or no-salt-added canned vegetables.   Remember to also reduce the salt and sodium in your diet. Try to have no more than 2000 milligrams (mg) of sodium per day, with a goal of further reducing the sodium to 1500 mg per day. Three important ways to reduce sodium are:  . Use reduced-sodium or no-salt-added food products.  . Use less salt when you prepare foods and do not add salt to your food at the table.  . Read fool labels. Aim for foods that are less than 5 percent of the daily value of sodium.   The DASH eating plan was not designed for weight loss. But it contains many lower calorie foods, such as fruits and vegetables. You can make it lower in calories by replacing higher calorie foods with more fruits and vegetables. Some ideas to increase fruits and vegetables and decrease calories include:  . Eat a medium apple instead of four shortbread cookies. You'll save 80 calories.  . Eat 1/4 cup of dried apricots instead of a 2-ounce bag of pork rinds. You'll save 230 calories.  . Have a hamburger that's 3 ounces instead of 6 ounces. Add a  cup serving of carrots and a 1/2 cup serving of spinach. You'll save more than 200 calories.  . Instead of 5 ounces of chicken, have a stir fry with 2 ounces ofchicken and 1 and 1/2 cups of raw vegetables. Use a small amount of vegetable oil. You'll save 50 calories.  . Have a 1/2 cup serving of low-fat frozen yogurt instead of a 1 and 1/2 ounce milk chocolate bar. You'll save about 110 calories.  . Use low-fat or fat-free condiments, such as fat free salad dressings.  . Eat smaller portions--cut back gradually.  . Use food labels to compare fat content in packaged foods. Items marked low-fat or fat-free may be lower in fat without being lower in calories than their regular versions.  . Limit foods with lots of added sugar, such as pies, flavored yogurts, candy bars, ice cream, sherbet, regular soft drinks, and fruit drinks.  . Drink water  or club soda instead of cola or other soda drinks.    

## 2016-12-04 NOTE — Progress Notes (Signed)
Name: Jesse Moody   MRN: [id number]    DOB: [birth year]   Date:12/04/2016       Progress Note  Subjective  Chief Complaint  Chief Complaint  Patient presents with  . Hypertension    Hypertension  This is a new problem. The current episode started more than 1 month ago. The problem has been gradually improving since onset. The problem is controlled. Pertinent negatives include no anxiety, blurred vision, chest pain, headaches, malaise/fatigue, neck pain, orthopnea, palpitations, peripheral edema, PND, shortness of breath or sweats. There are no associated agents to hypertension. There are no known risk factors for coronary artery disease. Past treatments include diuretics. The current treatment provides moderate improvement. There is no history of angina, kidney disease, CVA, heart failure, left ventricular hypertrophy, PVD or retinopathy. There is no history of chronic renal disease, a hypertension causing med or renovascular disease.    No problem-specific Assessment & Plan notes found for this encounter.   Past Medical History:  Diagnosis Date  . Allergy   . GERD (gastroesophageal reflux disease)   . Hypertension     Past Surgical History:  Procedure Laterality Date  . ELBOW SURGERY    . HERNIA REPAIR  1998    Family History  Problem Relation Age of Onset  . Cancer Maternal Grandmother     Social History   Social History  . Marital status: Married    Spouse name: N/A  . Number of children: N/A  . Years of education: N/A   Occupational History  . Not on file.   Social History Main Topics  . Smoking status: Current Every Day Smoker  . Smokeless tobacco: Former NeurosurgeonUser  . Alcohol use No  . Drug use: No  . Sexual activity: Yes   Other Topics Concern  . Not on file   Social History Narrative  . No narrative on file    Allergies  Allergen Reactions  . Penicillins Hives    Outpatient Medications Prior to Visit  Medication Sig Dispense Refill  . acetaminophen  (TYLENOL) 325 MG tablet Take 650 mg by mouth every 6 (six) hours as needed.    . Famotidine-Ca Carb-Mag Hydrox (ACID REDUCER + ANTACID PO) Take 1 capsule by mouth 2 (two) times daily.    Marland Kitchen. lisinopril-hydrochlorothiazide (PRINZIDE,ZESTORETIC) 20-25 MG tablet Take 1 tablet by mouth daily. 90 tablet 1  . cyclobenzaprine (FLEXERIL) 5 MG tablet Take 1 tablet by mouth. Dedra Skeensodd Mundy     No facility-administered medications prior to visit.     Review of Systems  Constitutional: Negative for chills, fever, malaise/fatigue and weight loss.  HENT: Negative for ear discharge, ear pain and sore throat.   Eyes: Negative for blurred vision.  Respiratory: Negative for cough, sputum production, shortness of breath and wheezing.   Cardiovascular: Negative for chest pain, palpitations, orthopnea, leg swelling and PND.  Gastrointestinal: Negative for abdominal pain, blood in stool, constipation, diarrhea, heartburn, melena and nausea.  Genitourinary: Negative for dysuria, frequency, hematuria and urgency.  Musculoskeletal: Negative for back pain, joint pain, myalgias and neck pain.  Skin: Negative for rash.  Neurological: Negative for dizziness, tingling, sensory change, focal weakness and headaches.  Endo/Heme/Allergies: Negative for environmental allergies and polydipsia. Does not bruise/bleed easily.  Psychiatric/Behavioral: Negative for depression and suicidal ideas. The patient is not nervous/anxious and does not have insomnia.      Objective  Vitals:   12/04/16 1535  BP: 118/70  Pulse: 72  Weight: 180 lb (81.6 kg)  Height: 5\' 6"  (  1.676 m)    Physical Exam  Constitutional: He is oriented to person, place, and time and well-developed, well-nourished, and in no distress.  HENT:  Head: Normocephalic.  Right Ear: External ear normal.  Left Ear: External ear normal.  Nose: Nose normal.  Mouth/Throat: Oropharynx is clear and moist.  Eyes: Pupils are equal, round, and reactive to light.  Conjunctivae and EOM are normal. Right eye exhibits no discharge. Left eye exhibits no discharge. No scleral icterus.  Neck: Normal range of motion. Neck supple. No JVD present. No tracheal deviation present. No thyromegaly present.  Cardiovascular: Normal rate, regular rhythm, normal heart sounds and intact distal pulses.  Exam reveals no gallop and no friction rub.   No murmur heard. Pulmonary/Chest: Breath sounds normal. No respiratory distress. He has no wheezes. He has no rales.  Abdominal: Soft. Bowel sounds are normal. He exhibits no mass. There is no hepatosplenomegaly. There is no tenderness. There is no rebound, no guarding and no CVA tenderness.  Musculoskeletal: Normal range of motion. He exhibits no edema or tenderness.  Lymphadenopathy:    He has no cervical adenopathy.  Neurological: He is alert and oriented to person, place, and time. He has normal sensation, normal strength, normal reflexes and intact cranial nerves. No cranial nerve deficit.  Skin: Skin is warm. No rash noted.  Psychiatric: Mood and affect normal.  Nursing note and vitals reviewed.     Assessment & Plan  Problem List Items Addressed This Visit      Cardiovascular and Mediastinum   Essential hypertension - Primary   Relevant Medications   lisinopril-hydrochlorothiazide (PRINZIDE,ZESTORETIC) 20-25 MG tablet    Other Visit Diagnoses    Influenza vaccine needed       Relevant Orders   Flu Vaccine QUAD 36+ mos IM (Completed)      Meds ordered this encounter  Medications  . lisinopril-hydrochlorothiazide (PRINZIDE,ZESTORETIC) 20-25 MG tablet    Sig: Take 1 tablet by mouth daily.    Dispense:  90 tablet    Refill:  1      Dr. Elizabeth Sauer [facility]  12/04/16

## 2016-12-19 DIAGNOSIS — M5441 Lumbago with sciatica, right side: Secondary | ICD-10-CM | POA: Diagnosis not present

## 2016-12-19 DIAGNOSIS — M9904 Segmental and somatic dysfunction of sacral region: Secondary | ICD-10-CM | POA: Diagnosis not present

## 2016-12-19 DIAGNOSIS — M9902 Segmental and somatic dysfunction of thoracic region: Secondary | ICD-10-CM | POA: Diagnosis not present

## 2016-12-19 DIAGNOSIS — M9903 Segmental and somatic dysfunction of lumbar region: Secondary | ICD-10-CM | POA: Diagnosis not present

## 2016-12-22 DIAGNOSIS — M5441 Lumbago with sciatica, right side: Secondary | ICD-10-CM | POA: Diagnosis not present

## 2016-12-22 DIAGNOSIS — M9903 Segmental and somatic dysfunction of lumbar region: Secondary | ICD-10-CM | POA: Diagnosis not present

## 2016-12-22 DIAGNOSIS — M9904 Segmental and somatic dysfunction of sacral region: Secondary | ICD-10-CM | POA: Diagnosis not present

## 2016-12-22 DIAGNOSIS — M9902 Segmental and somatic dysfunction of thoracic region: Secondary | ICD-10-CM | POA: Diagnosis not present

## 2016-12-25 DIAGNOSIS — M9903 Segmental and somatic dysfunction of lumbar region: Secondary | ICD-10-CM | POA: Diagnosis not present

## 2016-12-25 DIAGNOSIS — M545 Low back pain: Secondary | ICD-10-CM | POA: Diagnosis not present

## 2016-12-25 DIAGNOSIS — M9901 Segmental and somatic dysfunction of cervical region: Secondary | ICD-10-CM | POA: Diagnosis not present

## 2016-12-25 DIAGNOSIS — M542 Cervicalgia: Secondary | ICD-10-CM | POA: Diagnosis not present

## 2017-01-08 DIAGNOSIS — M9903 Segmental and somatic dysfunction of lumbar region: Secondary | ICD-10-CM | POA: Diagnosis not present

## 2017-01-08 DIAGNOSIS — M545 Low back pain: Secondary | ICD-10-CM | POA: Diagnosis not present

## 2017-01-08 DIAGNOSIS — M9902 Segmental and somatic dysfunction of thoracic region: Secondary | ICD-10-CM | POA: Diagnosis not present

## 2017-01-08 DIAGNOSIS — M9904 Segmental and somatic dysfunction of sacral region: Secondary | ICD-10-CM | POA: Diagnosis not present

## 2017-01-15 DIAGNOSIS — M545 Low back pain: Secondary | ICD-10-CM | POA: Diagnosis not present

## 2017-01-15 DIAGNOSIS — M9903 Segmental and somatic dysfunction of lumbar region: Secondary | ICD-10-CM | POA: Diagnosis not present

## 2017-01-15 DIAGNOSIS — M9902 Segmental and somatic dysfunction of thoracic region: Secondary | ICD-10-CM | POA: Diagnosis not present

## 2017-01-15 DIAGNOSIS — M9904 Segmental and somatic dysfunction of sacral region: Secondary | ICD-10-CM | POA: Diagnosis not present

## 2017-01-22 DIAGNOSIS — M545 Low back pain: Secondary | ICD-10-CM | POA: Diagnosis not present

## 2017-01-22 DIAGNOSIS — M9904 Segmental and somatic dysfunction of sacral region: Secondary | ICD-10-CM | POA: Diagnosis not present

## 2017-01-22 DIAGNOSIS — M9902 Segmental and somatic dysfunction of thoracic region: Secondary | ICD-10-CM | POA: Diagnosis not present

## 2017-01-22 DIAGNOSIS — M9903 Segmental and somatic dysfunction of lumbar region: Secondary | ICD-10-CM | POA: Diagnosis not present

## 2017-03-06 ENCOUNTER — Other Ambulatory Visit: Payer: Self-pay

## 2017-06-04 ENCOUNTER — Ambulatory Visit (INDEPENDENT_AMBULATORY_CARE_PROVIDER_SITE_OTHER): Payer: BLUE CROSS/BLUE SHIELD | Admitting: Family Medicine

## 2017-06-04 ENCOUNTER — Encounter: Payer: Self-pay | Admitting: Family Medicine

## 2017-06-04 VITALS — BP 120/80 | HR 80 | Ht 66.0 in | Wt 183.0 lb

## 2017-06-04 DIAGNOSIS — I10 Essential (primary) hypertension: Secondary | ICD-10-CM

## 2017-06-04 DIAGNOSIS — W57XXXA Bitten or stung by nonvenomous insect and other nonvenomous arthropods, initial encounter: Secondary | ICD-10-CM

## 2017-06-04 MED ORDER — LISINOPRIL-HYDROCHLOROTHIAZIDE 20-25 MG PO TABS: 1.0000 | ORAL_TABLET | Freq: Every day | ORAL | 2 refills | Status: DC

## 2017-06-04 MED ORDER — TRIAMCINOLONE ACETONIDE 0.1 % EX CREA: 1.0000 "application " | TOPICAL_CREAM | Freq: Two times a day (BID) | CUTANEOUS | 0 refills | Status: DC

## 2017-06-04 NOTE — Progress Notes (Addendum)
Name: Jesse Moody   MRN: [id number]    DOB: [birth year]   Date:06/04/2017       Progress Note  Subjective  Chief Complaint  Chief Complaint  Patient presents with  . Hypertension    Hypertension  This is a chronic problem. The current episode started more than 1 year ago. The problem is unchanged. The problem is controlled. Pertinent negatives include no blurred vision, chest pain, headaches, malaise/fatigue, neck pain, orthopnea, palpitations, PND, shortness of breath or sweats. The current treatment provides moderate improvement. There are no compliance problems.  There is no history of angina, kidney disease, CAD/MI, heart failure, left ventricular hypertrophy, PVD or retinopathy. There is no history of chronic renal disease, a hypertension causing med or renovascular disease.    No problem-specific Assessment & Plan notes found for this encounter.   Past Medical History:  Diagnosis Date  . Allergy   . GERD (gastroesophageal reflux disease)   . Hypertension     Past Surgical History:  Procedure Laterality Date  . ELBOW SURGERY    . HERNIA REPAIR  1998    Family History  Problem Relation Age of Onset  . Cancer Maternal Grandmother     Social History   Socioeconomic History  . Marital status: Married    Spouse name: Not on file  . Number of children: Not on file  . Years of education: Not on file  . Highest education level: Not on file  Social Needs  . Financial resource strain: Not on file  . Food insecurity - worry: Not on file  . Food insecurity - inability: Not on file  . Transportation needs - medical: Not on file  . Transportation needs - non-medical: Not on file  Occupational History  . Not on file  Tobacco Use  . Smoking status: Current Every Day Smoker  . Smokeless tobacco: Former Engineer, water and Sexual Activity  . Alcohol use: No    Alcohol/week: 0.0 oz  . Drug use: No  . Sexual activity: Yes  Other Topics Concern  . Not on file  Social History  Narrative  . Not on file    Allergies  Allergen Reactions  . Penicillins Hives    Outpatient Medications Prior to Visit  Medication Sig Dispense Refill  . acetaminophen (TYLENOL) 325 MG tablet Take 650 mg by mouth every 6 (six) hours as needed.    . Famotidine-Ca Carb-Mag Hydrox (ACID REDUCER + ANTACID PO) Take 1 capsule by mouth 2 (two) times daily.    Marland Kitchen lisinopril-hydrochlorothiazide (PRINZIDE,ZESTORETIC) 20-25 MG tablet Take 1 tablet by mouth daily. 90 tablet 1  . cyclobenzaprine (FLEXERIL) 5 MG tablet Take 1 tablet by mouth. Dedra Skeens     No facility-administered medications prior to visit.     Review of Systems  Constitutional: Negative for chills, fever, malaise/fatigue and weight loss.  HENT: Negative for ear discharge, ear pain and sore throat.   Eyes: Negative for blurred vision.  Respiratory: Negative for cough, sputum production, shortness of breath and wheezing.   Cardiovascular: Negative for chest pain, palpitations, orthopnea, leg swelling and PND.  Gastrointestinal: Negative for abdominal pain, blood in stool, constipation, diarrhea, heartburn, melena and nausea.  Genitourinary: Negative for dysuria, frequency, hematuria and urgency.  Musculoskeletal: Negative for back pain, joint pain, myalgias and neck pain.  Skin: Negative for rash.  Neurological: Negative for dizziness, tingling, sensory change, focal weakness and headaches.  Endo/Heme/Allergies: Negative for environmental allergies and polydipsia. Does not bruise/bleed easily.  Psychiatric/Behavioral: Negative  for depression and suicidal ideas. The patient is not nervous/anxious and does not have insomnia.      Objective  Vitals:   06/04/17 1625  BP: 120/80  Pulse: 80  Weight: 183 lb (83 kg)  Height: 5\' 6"  (1.676 m)    Physical Exam  Constitutional: He is oriented to person, place, and time and well-developed, well-nourished, and in no distress.  HENT:  Head: Normocephalic.  Right Ear: External  ear normal.  Left Ear: External ear normal.  Nose: Nose normal.  Mouth/Throat: Oropharynx is clear and moist.  Eyes: Conjunctivae and EOM are normal. Pupils are equal, round, and reactive to light. Right eye exhibits no discharge. Left eye exhibits no discharge. No scleral icterus.  Neck: Normal range of motion. Neck supple. No JVD present. No tracheal deviation present. No thyromegaly present.  Cardiovascular: Normal rate, regular rhythm, normal heart sounds and intact distal pulses. Exam reveals no gallop and no friction rub.  No murmur heard. Pulmonary/Chest: Breath sounds normal. No respiratory distress. He has no wheezes. He has no rales.  Abdominal: Soft. Bowel sounds are normal. He exhibits no mass. There is no hepatosplenomegaly. There is no tenderness. There is no rebound, no guarding and no CVA tenderness.  Musculoskeletal: Normal range of motion. He exhibits no edema or tenderness.  Lymphadenopathy:    He has no cervical adenopathy.  Neurological: He is alert and oriented to person, place, and time. He has normal sensation, normal strength, normal reflexes and intact cranial nerves. No cranial nerve deficit.  Skin: Skin is warm. No rash noted. There is erythema.     Pruritic/erythema area 1x1cm superior to right nipple/2-3 days ago  Psychiatric: Mood and affect normal.  Nursing note and vitals reviewed.     Assessment & Plan  Problem List Items Addressed This Visit      Cardiovascular and Mediastinum   Essential hypertension - Primary   Relevant Medications   lisinopril-hydrochlorothiazide (PRINZIDE,ZESTORETIC) 20-25 MG tablet    Other Visit Diagnoses    Insect bite, initial encounter       Relevant Medications   triamcinolone cream (KENALOG) 0.1 %      Meds ordered this encounter  Medications  . lisinopril-hydrochlorothiazide (PRINZIDE,ZESTORETIC) 20-25 MG tablet    Sig: Take 1 tablet by mouth daily.    Dispense:  90 tablet    Refill:  2  . triamcinolone  cream (KENALOG) 0.1 %    Sig: Apply 1 application topically 2 (two) times daily.    Dispense:  30 g    Refill:  0      Dr. Hayden Rasmusseneanna Jones [facility]  06/04/17

## 2017-07-06 ENCOUNTER — Ambulatory Visit (INDEPENDENT_AMBULATORY_CARE_PROVIDER_SITE_OTHER): Payer: BLUE CROSS/BLUE SHIELD | Admitting: Family Medicine

## 2017-07-06 ENCOUNTER — Encounter: Payer: Self-pay | Admitting: Family Medicine

## 2017-07-06 VITALS — BP 120/70 | HR 88 | Temp 98.3°F | Ht 66.0 in | Wt 180.0 lb

## 2017-07-06 DIAGNOSIS — J309 Allergic rhinitis, unspecified: Secondary | ICD-10-CM | POA: Diagnosis not present

## 2017-07-06 DIAGNOSIS — M722 Plantar fascial fibromatosis: Secondary | ICD-10-CM

## 2017-07-06 MED ORDER — MONTELUKAST SODIUM 10 MG PO TABS: 10.0000 mg | ORAL_TABLET | Freq: Every day | ORAL | 3 refills | Status: DC

## 2017-07-06 MED ORDER — MELOXICAM 15 MG PO TABS: 15.0000 mg | ORAL_TABLET | Freq: Every day | ORAL | 0 refills | Status: DC

## 2017-07-06 NOTE — Progress Notes (Signed)
Name: Jesse Moody   MRN: [id number]    DOB: [birth year]   Date:07/06/2017       Progress Note  Subjective  Chief Complaint  Chief Complaint  Patient presents with  . Foot Pain    L) heel pain radiating up back of tendon- is worse after sleeping or sitting for 15-20 minutes  . Sinusitis    cong, "coming down with a sickness"    Foot Pain  This is a new problem. The current episode started more than 1 month ago. The problem occurs constantly. The problem has been gradually worsening. Associated symptoms include arthralgias, congestion, a sore throat and swollen glands. Pertinent negatives include no abdominal pain, anorexia, change in bowel habit, chest pain, chills, coughing, diaphoresis, fatigue, fever, headaches, joint swelling, myalgias, nausea, neck pain, numbness, rash, urinary symptoms, vertigo, visual change, vomiting or weakness. Associated symptoms comments: Knee pain. The symptoms are aggravated by smoking. He has tried acetaminophen and NSAIDs for the symptoms. The treatment provided moderate relief.  Sinusitis  This is a new problem. The current episode started today. The problem has been gradually improving since onset. There has been no fever. The pain is mild. Associated symptoms include congestion, sinus pressure, a sore throat and swollen glands. Pertinent negatives include no chills, coughing, diaphoresis, ear pain, headaches, hoarse voice, neck pain, shortness of breath or sneezing. (Nasal discharge) Past treatments include acetaminophen and oral decongestants. The treatment provided no relief.    No problem-specific Assessment & Plan notes found for this encounter.   Past Medical History:  Diagnosis Date  . Allergy   . GERD (gastroesophageal reflux disease)   . Hypertension     Past Surgical History:  Procedure Laterality Date  . ELBOW SURGERY    . HERNIA REPAIR  1998    Family History  Problem Relation Age of Onset  . Cancer Maternal Grandmother     Social  History   Socioeconomic History  . Marital status: Married    Spouse name: Not on file  . Number of children: Not on file  . Years of education: Not on file  . Highest education level: Not on file  Occupational History  . Not on file  Social Needs  . Financial resource strain: Not on file  . Food insecurity:    Worry: Not on file    Inability: Not on file  . Transportation needs:    Medical: Not on file    Non-medical: Not on file  Tobacco Use  . Smoking status: Current Every Day Smoker  . Smokeless tobacco: Former Engineer, water and Sexual Activity  . Alcohol use: No    Alcohol/week: 0.0 oz  . Drug use: No  . Sexual activity: Yes  Lifestyle  . Physical activity:    Days per week: Not on file    Minutes per session: Not on file  . Stress: Not on file  Relationships  . Social connections:    Talks on phone: Not on file    Gets together: Not on file    Attends religious service: Not on file    Active member of club or organization: Not on file    Attends meetings of clubs or organizations: Not on file    Relationship status: Not on file  . Intimate partner violence:    Fear of current or ex partner: Not on file    Emotionally abused: Not on file    Physically abused: Not on file    Forced sexual activity:  Not on file  Other Topics Concern  . Not on file  Social History Narrative  . Not on file    Allergies  Allergen Reactions  . Penicillins Hives    Outpatient Medications Prior to Visit  Medication Sig Dispense Refill  . acetaminophen (TYLENOL) 325 MG tablet Take 650 mg by mouth every 6 (six) hours as needed.    . Famotidine-Ca Carb-Mag Hydrox (ACID REDUCER + ANTACID PO) Take 1 capsule by mouth 2 (two) times daily.    Marland Kitchen. lisinopril-hydrochlorothiazide (PRINZIDE,ZESTORETIC) 20-25 MG tablet Take 1 tablet by mouth daily. 90 tablet 2  . triamcinolone cream (KENALOG) 0.1 % Apply 1 application topically 2 (two) times daily. (Patient not taking: Reported on  07/06/2017) 30 g 0   No facility-administered medications prior to visit.     Review of Systems  Constitutional: Negative for chills, diaphoresis, fatigue, fever, malaise/fatigue and weight loss.  HENT: Positive for congestion, sinus pressure and sore throat. Negative for ear discharge, ear pain, hoarse voice and sneezing.   Eyes: Negative for blurred vision.  Respiratory: Negative for cough, sputum production, shortness of breath and wheezing.   Cardiovascular: Negative for chest pain, palpitations and leg swelling.  Gastrointestinal: Negative for abdominal pain, anorexia, blood in stool, change in bowel habit, constipation, diarrhea, heartburn, melena, nausea and vomiting.  Genitourinary: Negative for dysuria, frequency, hematuria and urgency.  Musculoskeletal: Positive for arthralgias. Negative for back pain, joint pain, joint swelling, myalgias and neck pain.  Skin: Negative for rash.  Neurological: Negative for dizziness, vertigo, tingling, sensory change, focal weakness, weakness, numbness and headaches.  Endo/Heme/Allergies: Negative for environmental allergies and polydipsia. Does not bruise/bleed easily.  Psychiatric/Behavioral: Negative for depression and suicidal ideas. The patient is not nervous/anxious and does not have insomnia.      Objective  Vitals:   07/06/17 1025  BP: 120/70  Pulse: 88  Temp: 98.3 F (36.8 C)  TempSrc: Oral  Weight: 180 lb (81.6 kg)  Height: 5\' 6"  (1.676 m)    Physical Exam  Constitutional: He is oriented to person, place, and time and well-developed, well-nourished, and in no distress.  HENT:  Head: Normocephalic.  Right Ear: External ear normal.  Left Ear: External ear normal.  Nose: Nose normal.  Mouth/Throat: Oropharynx is clear and moist.  Eyes: Pupils are equal, round, and reactive to light. Conjunctivae and EOM are normal. Right eye exhibits no discharge. Left eye exhibits no discharge. No scleral icterus.  Neck: Normal range of  motion. Neck supple. No JVD present. No tracheal deviation present. No thyromegaly present.  Cardiovascular: Normal rate, regular rhythm, normal heart sounds and intact distal pulses. Exam reveals no gallop and no friction rub.  No murmur heard. Pulmonary/Chest: Breath sounds normal. No respiratory distress. He has no wheezes. He has no rales.  Abdominal: Soft. Bowel sounds are normal. He exhibits no mass. There is no hepatosplenomegaly. There is no tenderness. There is no rebound, no guarding and no CVA tenderness.  Musculoskeletal: Normal range of motion. He exhibits no edema.       Left foot: There is tenderness.       Feet:  Tender plantar left foot  Lymphadenopathy:    He has no cervical adenopathy.  Neurological: He is alert and oriented to person, place, and time. He has normal sensation, normal strength, normal reflexes and intact cranial nerves. No cranial nerve deficit.  Skin: Skin is warm. No rash noted.  Psychiatric: Mood and affect normal.  Nursing note and vitals reviewed.  Assessment & Plan  Problem List Items Addressed This Visit    None    Visit Diagnoses    Plantar fasciitis    -  Primary   Relevant Medications   meloxicam (MOBIC) 15 MG tablet   Allergic sinusitis          Meds ordered this encounter  Medications  . meloxicam (MOBIC) 15 MG tablet    Sig: Take 1 tablet (15 mg total) by mouth daily.    Dispense:  30 tablet    Refill:  0  . montelukast (SINGULAIR) 10 MG tablet    Sig: Take 1 tablet (10 mg total) by mouth at bedtime.    Dispense:  30 tablet    Refill:  3      Dr. [facility]  07/06/17

## 2017-07-06 NOTE — Patient Instructions (Signed)
Plantar Fasciitis Rehab  Ask your health care provider which exercises are safe for you. Do exercises exactly as told by your health care provider and adjust them as directed. It is normal to feel mild stretching, pulling, tightness, or discomfort as you do these exercises, but you should stop right away if you feel sudden pain or your pain gets worse. Do not begin these exercises until told by your health care provider.  Stretching and range of motion exercises  These exercises warm up your muscles and joints and improve the movement and flexibility of your foot. These exercises also help to relieve pain.  Exercise A: Plantar fascia stretch    1. Sit with your left / right leg crossed over your opposite knee.  2. Hold your heel with one hand with that thumb near your arch. With your other hand, hold your toes and gently pull them back toward the top of your foot. You should feel a stretch on the bottom of your toes or your foot or both.  3. Hold this stretch for__________ seconds.  4. Slowly release your toes and return to the starting position.  Repeat __________ times. Complete this exercise __________ times a day.  Exercise B: Gastroc, standing    1. Stand with your hands against a wall.  2. Extend your left / right leg behind you, and bend your front knee slightly.  3. Keeping your heels on the floor and keeping your back knee straight, shift your weight toward the wall without arching your back. You should feel a gentle stretch in your left / right calf.  4. Hold this position for __________ seconds.  Repeat __________ times. Complete this exercise __________ times a day.  Exercise C: Soleus, standing  1. Stand with your hands against a wall.  2. Extend your left / right leg behind you, and bend your front knee slightly.  3. Keeping your heels on the floor, bend your back knee and slightly shift your weight over the back leg. You should feel a gentle stretch deep in your calf.  4. Hold this position for  __________ seconds.  Repeat __________ times. Complete this exercise __________ times a day.  Exercise D: Gastrocsoleus, standing  1. Stand with the ball of your left / right foot on a step. The ball of your foot is on the walking surface, right under your toes.  2. Keep your other foot firmly on the same step.  3. Hold onto the wall or a railing for balance.  4. Slowly lift your other foot, allowing your body weight to press your heel down over the edge of the step. You should feel a stretch in your left / right calf.  5. Hold this position for __________ seconds.  6. Return both feet to the step.  7. Repeat this exercise with a slight bend in your left / right knee.  Repeat __________ times with your left / right knee straight and __________ times with your left / right knee bent. Complete this exercise __________ times a day.  Balance exercise  This exercise builds your balance and strength control of your arch to help take pressure off your plantar fascia.  Exercise E: Single leg stand  1. Without shoes, stand near a railing or in a doorway. You may hold onto the railing or door frame as needed.  2. Stand on your left / right foot. Keep your big toe down on the floor and try to keep your arch lifted. Do not   let your foot roll inward.  3. Hold this position for __________ seconds.  4. If this exercise is too easy, you can try it with your eyes closed or while standing on a pillow.  Repeat __________ times. Complete this exercise __________ times a day.  This information is not intended to replace advice given to you by your health care provider. Make sure you discuss any questions you have with your health care provider.  Document Released: 03/17/2005 Document Revised: 11/20/2015 Document Reviewed: 01/29/2015  Elsevier Interactive Patient Education  2018 [employer]      Plantar Fasciitis  Plantar fasciitis is a painful foot condition that affects the heel. It occurs when the band of tissue that connects the  toes to the heel bone (plantar fascia) becomes irritated. This can happen after exercising too much or doing other repetitive activities (overuse injury). The pain from plantar fasciitis can range from mild irritation to severe pain that makes it difficult for you to walk or move. The pain is usually worse in the morning or after you have been sitting or lying down for a while.  What are the causes?  This condition may be caused by:   Standing for long periods of time.   Wearing shoes that do not fit.   Doing high-impact activities, including running, aerobics, and ballet.   Being overweight.   Having an abnormal way of walking (gait).   Having tight calf muscles.   Having high arches in your feet.   Starting a new athletic activity.    What are the signs or symptoms?  The main symptom of this condition is heel pain. Other symptoms include:   Pain that gets worse after activity or exercise.   Pain that is worse in the morning or after resting.   Pain that goes away after you walk for a few minutes.    How is this diagnosed?  This condition may be diagnosed based on your signs and symptoms. Your health care provider will also do a physical exam to check for:   A tender area on the bottom of your foot.   A high arch in your foot.   Pain when you move your foot.   Difficulty moving your foot.    You may also need to have imaging studies to confirm the diagnosis. These can include:   X-rays.   Ultrasound.   MRI.    How is this treated?  Treatment for plantar fasciitis depends on the severity of the condition. Your treatment may include:   Rest, ice, and over-the-counter pain medicines to manage your pain.   Exercises to stretch your calves and your plantar fascia.   A splint that holds your foot in a stretched, upward position while you sleep (night splint).   Physical therapy to relieve symptoms and prevent problems in the future.   Cortisone injections to relieve severe pain.   Extracorporeal  shock wave therapy (ESWT) to stimulate damaged plantar fascia with electrical impulses. It is often used as a last resort before surgery.   Surgery, if other treatments have not worked after 12 months.    Follow these instructions at home:   Take medicines only as directed by your health care provider.   Avoid activities that cause pain.   Roll the bottom of your foot over a bag of ice or a bottle of cold water. Do this for 20 minutes, 3-4 times a day.   Perform simple stretches as directed by your health   care provider.   Try wearing athletic shoes with air-sole or gel-sole cushions or soft shoe inserts.   Wear a night splint while sleeping, if directed by your health care provider.   Keep all follow-up appointments with your health care provider.  How is this prevented?   Do not perform exercises or activities that cause heel pain.   Consider finding low-impact activities if you continue to have problems.   Lose weight if you need to.  The best way to prevent plantar fasciitis is to avoid the activities that aggravate your plantar fascia.  Contact a health care provider if:   Your symptoms do not go away after treatment with home care measures.   Your pain gets worse.   Your pain affects your ability to move or do your daily activities.  This information is not intended to replace advice given to you by your health care provider. Make sure you discuss any questions you have with your health care provider.  Document Released: 12/10/2000 Document Revised: 08/20/2015 Document Reviewed: 01/25/2014  [employer]vier Interactive Patient Education  2018 [employer]

## 2017-08-10 ENCOUNTER — Other Ambulatory Visit: Payer: Self-pay

## 2017-08-10 DIAGNOSIS — M722 Plantar fascial fibromatosis: Secondary | ICD-10-CM

## 2017-08-10 MED ORDER — MELOXICAM 15 MG PO TABS: 15.0000 mg | ORAL_TABLET | Freq: Every day | ORAL | 5 refills | Status: DC

## 2017-12-11 ENCOUNTER — Ambulatory Visit: Payer: BLUE CROSS/BLUE SHIELD | Admitting: Family Medicine

## 2018-01-14 ENCOUNTER — Ambulatory Visit: Payer: BLUE CROSS/BLUE SHIELD | Admitting: Family Medicine

## 2018-01-21 ENCOUNTER — Ambulatory Visit: Payer: BLUE CROSS/BLUE SHIELD | Admitting: Family Medicine

## 2018-01-28 ENCOUNTER — Encounter: Payer: Self-pay | Admitting: Family Medicine

## 2018-01-28 ENCOUNTER — Other Ambulatory Visit: Payer: Self-pay

## 2018-01-28 ENCOUNTER — Ambulatory Visit (INDEPENDENT_AMBULATORY_CARE_PROVIDER_SITE_OTHER): Payer: BLUE CROSS/BLUE SHIELD | Admitting: Family Medicine

## 2018-01-28 VITALS — BP 120/78 | HR 80 | Ht 66.0 in | Wt 188.0 lb

## 2018-01-28 DIAGNOSIS — F17200 Nicotine dependence, unspecified, uncomplicated: Secondary | ICD-10-CM

## 2018-01-28 DIAGNOSIS — Z23 Encounter for immunization: Secondary | ICD-10-CM | POA: Diagnosis not present

## 2018-01-28 DIAGNOSIS — I10 Essential (primary) hypertension: Secondary | ICD-10-CM | POA: Diagnosis not present

## 2018-01-28 DIAGNOSIS — M722 Plantar fascial fibromatosis: Secondary | ICD-10-CM

## 2018-01-28 DIAGNOSIS — R69 Illness, unspecified: Secondary | ICD-10-CM

## 2018-01-28 MED ORDER — LISINOPRIL-HYDROCHLOROTHIAZIDE 10-12.5 MG PO TABS: 1.0000 | ORAL_TABLET | Freq: Every day | ORAL | 1 refills | Status: DC

## 2018-01-28 MED ORDER — MELOXICAM 15 MG PO TABS: 15.0000 mg | ORAL_TABLET | Freq: Every day | ORAL | 5 refills | Status: DC

## 2018-01-28 NOTE — Progress Notes (Signed)
Date:  01/28/2018   Name:  Jesse Moody   DOB:  April 14, 1970   MRN:  [id number]   Chief Complaint: Hypertension; plantar fasciaitis (needs refill on meloxicam); Flu Vaccine; and tdap Hypertension  This is a chronic problem. The current episode started more than 1 year ago. The problem has been gradually improving since onset. The problem is controlled. Pertinent negatives include no anxiety, blurred vision, chest pain, headaches, malaise/fatigue, neck pain, orthopnea, palpitations, peripheral edema, PND, shortness of breath or sweats. There are no associated agents to hypertension. Risk factors for coronary artery disease include male gender. Past treatments include ACE inhibitors and diuretics. The current treatment provides mild improvement. There are no compliance problems.  There is no history of angina, kidney disease, CAD/MI, CVA, heart failure, left ventricular hypertrophy, PVD or retinopathy. There is no history of chronic renal disease, a hypertension causing med or renovascular disease.  Foot Injury   Injury mechanism: for plantar faciitis. The pain is present in the left heel. The quality of the pain is described as aching. The pain is moderate. Pertinent negatives include no numbness. The symptoms are aggravated by palpation.     Review of Systems  Constitutional: Negative for chills, fever and malaise/fatigue.  HENT: Negative for drooling, ear discharge, ear pain and sore throat.   Eyes: Negative for blurred vision.  Respiratory: Negative for cough, shortness of breath and wheezing.   Cardiovascular: Negative for chest pain, palpitations, orthopnea, leg swelling and PND.  Gastrointestinal: Negative for abdominal pain, blood in stool, constipation, diarrhea and nausea.  Endocrine: Negative for polydipsia.  Genitourinary: Negative for dysuria, frequency, hematuria and urgency.  Musculoskeletal: Negative for back pain, myalgias and neck pain.  Skin: Negative for rash.    Allergic/Immunologic: Negative for environmental allergies.  Neurological: Negative for dizziness, numbness and headaches.  Hematological: Does not bruise/bleed easily.  Psychiatric/Behavioral: Negative for suicidal ideas. The patient is not nervous/anxious.     Patient Active Problem List   Diagnosis Date Noted  . Degenerative cervical disc 09/07/2015  . Essential hypertension 09/07/2015    Allergies  Allergen Reactions  . Penicillins Hives    Past Surgical History:  Procedure Laterality Date  . ELBOW SURGERY    . HERNIA REPAIR  1998    Social History   Tobacco Use  . Smoking status: Current Every Day Smoker  . Smokeless tobacco: Former Engineer, water Use Topics  . Alcohol use: No    Alcohol/week: 0.0 standard drinks  . Drug use: No     Medication list has been reviewed and updated.  Current Meds  Medication Sig  . acetaminophen (TYLENOL) 325 MG tablet Take 650 mg by mouth every 6 (six) hours as needed.  . famotidine (PEPCID) 10 MG tablet Take by mouth.  . Famotidine-Ca Carb-Mag Hydrox (ACID REDUCER + ANTACID PO) Take 1 capsule by mouth 2 (two) times daily.  Marland Kitchen lisinopril-hydrochlorothiazide (PRINZIDE,ZESTORETIC) 10-12.5 MG tablet Take 1 tablet by mouth daily.  . meloxicam (MOBIC) 15 MG tablet Take 1 tablet (15 mg total) by mouth daily.  Marland Kitchen topiramate (TOPAMAX) 25 MG tablet Take 25 mg by mouth 2 (two) times daily. Dr Freida BusmanEduard Clos  . [DISCONTINUED] lisinopril-hydrochlorothiazide (PRINZIDE,ZESTORETIC) 10-12.5 MG tablet Take by mouth.  . [DISCONTINUED] meloxicam (MOBIC) 15 MG tablet Take 1 tablet (15 mg total) by mouth daily.    PHQ 2/9 Scores 01/28/2018 12/04/2016 10/05/2015 08/21/2015  PHQ - 2 Score 0 0 0 0  PHQ- 9 Score 0 0 - -  Physical Exam  Constitutional: He is oriented to person, place, and time.  HENT:  Head: Normocephalic.  Right Ear: External ear normal.  Left Ear: External ear normal.  Nose: Nose normal.  Mouth/Throat: Oropharynx is clear and  moist.  Eyes: Pupils are equal, round, and reactive to light. Conjunctivae and EOM are normal. Right eye exhibits no discharge. Left eye exhibits no discharge. No scleral icterus.  Neck: Normal range of motion. Neck supple. No JVD present. No tracheal deviation present. No thyromegaly present.  Cardiovascular: Normal rate, regular rhythm, S1 normal, S2 normal, normal heart sounds and intact distal pulses. PMI is not displaced. Exam reveals no gallop, no S3, no S4, no friction rub and no decreased pulses.  No murmur heard.  No systolic murmur is present.  No diastolic murmur is present. Pulses:      Carotid pulses are 2+ on the right side, and 2+ on the left side.      Radial pulses are 2+ on the right side, and 2+ on the left side.       Femoral pulses are 2+ on the right side, and 2+ on the left side.      Popliteal pulses are 2+ on the right side, and 2+ on the left side.       Dorsalis pedis pulses are 2+ on the right side, and 2+ on the left side.       Posterior tibial pulses are 2+ on the right side, and 2+ on the left side.  Pulmonary/Chest: Breath sounds normal. No respiratory distress. He has no decreased breath sounds. He has no wheezes. He has no rhonchi. He has no rales.  Abdominal: Soft. Bowel sounds are normal. He exhibits no mass. There is no hepatosplenomegaly. There is no tenderness. There is no rebound, no guarding and no CVA tenderness.  Musculoskeletal: Normal range of motion. He exhibits no edema or tenderness.  Lymphadenopathy:    He has no cervical adenopathy.  Neurological: He is alert and oriented to person, place, and time. He has normal strength and normal reflexes. No cranial nerve deficit.  Skin: Skin is warm. No rash noted.  Nursing note and vitals reviewed.   BP 120/78   Pulse 80   Ht 5\' 6"  (1.676 m)   Wt 188 lb (85.3 kg)   BMI 30.34 kg/m   Assessment and Plan: 1. Essential hypertension Stable on meds- refill lisinopril/HCTZ/ draw renal panel -  lisinopril-hydrochlorothiazide (PRINZIDE,ZESTORETIC) 10-12.5 MG tablet; Take 1 tablet by mouth daily.  Dispense: 90 tablet; Refill: 1 - Renal Function Panel  2. Plantar fasciitis Refill meloxicam/ draw renal panel/ gave info on Dr Ether Griffins to pt. If decides to go to podiatry for further eval - meloxicam (MOBIC) 15 MG tablet; Take 1 tablet (15 mg total) by mouth daily.  Dispense: 30 tablet; Refill: 5  3. Tobacco dependence Discussed smoking cessation  4. Influenza vaccine needed administered - Flu Vaccine QUAD 36+ mos IM  5. Need for diphtheria-tetanus-pertussis (Tdap) vaccine administered - Tdap vaccine greater than or equal to 7yo IM - Hepatic Function Panel (6)  6. Taking medication for chronic disease Draw hepatic function due to taking Tylenol 500mg  tabs  2) BID      Dr. [facility]  01/28/2018

## 2018-01-29 LAB — RENAL FUNCTION PANEL
Albumin: 4.7 g/dL (ref 3.5–5.5)
BUN / CREAT RATIO: 20 (ref 9–20)
BUN: 24 mg/dL (ref 6–24)
CALCIUM: 9.8 mg/dL (ref 8.7–10.2)
CO2: 21 mmol/L (ref 20–29)
CREATININE: 1.19 mg/dL (ref 0.76–1.27)
Chloride: 102 mmol/L (ref 96–106)
GFR calc non Af Amer: 72 mL/min/{1.73_m2} (ref >59)
GFR, EST AFRICAN AMERICAN: 83 mL/min/{1.73_m2} (ref >59)
Glucose: 79 mg/dL (ref 65–99)
Phosphorus: 5.1 mg/dL — ABNORMAL HIGH (ref 2.5–4.5)
Potassium: 4.2 mmol/L (ref 3.5–5.2)
Sodium: 140 mmol/L (ref 134–144)

## 2018-01-29 LAB — HEPATIC FUNCTION PANEL (6)
ALK PHOS: 148 IU/L — AB (ref 39–117)
ALT: 28 IU/L (ref 0–44)
AST: 24 IU/L (ref 0–40)
Bilirubin Total: <0.2 mg/dL (ref 0.0–1.2)
Bilirubin, Direct: 0.07 mg/dL (ref 0.00–0.40)

## 2018-04-15 ENCOUNTER — Ambulatory Visit (INDEPENDENT_AMBULATORY_CARE_PROVIDER_SITE_OTHER): Payer: BLUE CROSS/BLUE SHIELD | Admitting: Family Medicine

## 2018-04-15 ENCOUNTER — Encounter: Payer: Self-pay | Admitting: Family Medicine

## 2018-04-15 VITALS — BP 120/70 | HR 84 | Ht 66.0 in | Wt 189.0 lb

## 2018-04-15 DIAGNOSIS — F419 Anxiety disorder, unspecified: Secondary | ICD-10-CM | POA: Diagnosis not present

## 2018-04-15 DIAGNOSIS — F321 Major depressive disorder, single episode, moderate: Secondary | ICD-10-CM

## 2018-04-15 MED ORDER — SERTRALINE HCL 50 MG PO TABS: 50.0000 mg | ORAL_TABLET | Freq: Every day | ORAL | 3 refills | Status: DC

## 2018-04-15 MED ORDER — CLONAZEPAM 1 MG PO TABS: 1.0000 mg | ORAL_TABLET | Freq: Two times a day (BID) | ORAL | 1 refills | Status: DC | PRN

## 2018-04-15 NOTE — Progress Notes (Signed)
Date:  04/15/2018   Name:  Jesse Moody   DOB:  01-Nov-1970   MRN:  [id number]   Chief Complaint: Depression (PHQ=13 GAD7=14) and liver labs (recheck liver function)  Depression       The patient presents with depression.  This is a new (2weeks) problem.  The current episode started 1 to 4 weeks ago.   The onset quality is gradual.   The problem occurs constantly.  The problem has been gradually worsening since onset.  Associated symptoms include helplessness, hopelessness, insomnia, irritable, restlessness, decreased interest and sad.  Associated symptoms include no decreased concentration, no fatigue, no appetite change, no body aches, no myalgias, no headaches, no indigestion and no suicidal ideas.     The symptoms are aggravated by work stress.  Past treatments include SSRIs - Selective serotonin reuptake inhibitors (talked to HR).  Compliance with treatment is good.  Previous treatment provided no relief relief.  Past medical history includes anxiety and depression.     Review of Systems  Constitutional: Negative for appetite change, chills, fatigue and fever.  HENT: Negative for drooling, ear discharge, ear pain and sore throat.   Respiratory: Negative for cough, shortness of breath and wheezing.   Cardiovascular: Negative for chest pain, palpitations and leg swelling.  Gastrointestinal: Negative for abdominal pain, blood in stool, constipation, diarrhea and nausea.  Endocrine: Negative for polydipsia.  Genitourinary: Negative for dysuria, frequency, hematuria and urgency.  Musculoskeletal: Negative for back pain, myalgias and neck pain.  Skin: Negative for rash.  Allergic/Immunologic: Negative for environmental allergies.  Neurological: Negative for dizziness and headaches.  Hematological: Does not bruise/bleed easily.  Psychiatric/Behavioral: Positive for depression. Negative for decreased concentration and suicidal ideas. The patient has insomnia. The patient is not nervous/anxious.      Patient Active Problem List   Diagnosis Date Noted  . Degenerative cervical disc 09/07/2015  . Essential hypertension 09/07/2015    Allergies  Allergen Reactions  . Penicillins Hives    Past Surgical History:  Procedure Laterality Date  . ELBOW SURGERY    . HERNIA REPAIR  1998    Social History   Tobacco Use  . Smoking status: Current Every Day Smoker  . Smokeless tobacco: Former Engineer, water Use Topics  . Alcohol use: No    Alcohol/week: 0.0 standard drinks  . Drug use: No     Medication list has been reviewed and updated.  Current Meds  Medication Sig  . acetaminophen (TYLENOL) 325 MG tablet Take 650 mg by mouth every 6 (six) hours as needed.  . famotidine (PEPCID) 10 MG tablet Take by mouth.  . Famotidine-Ca Carb-Mag Hydrox (ACID REDUCER + ANTACID PO) Take 1 capsule by mouth 2 (two) times daily.  Marland Kitchen lisinopril-hydrochlorothiazide (PRINZIDE,ZESTORETIC) 10-12.5 MG tablet Take 1 tablet by mouth daily.  . meloxicam (MOBIC) 15 MG tablet Take 1 tablet (15 mg total) by mouth daily.  Marland Kitchen topiramate (TOPAMAX) 25 MG tablet Take 25 mg by mouth 2 (two) times daily. Dr Freida BusmanEduard Clos  . [DISCONTINUED] traMADol (ULTRAM) 50 MG tablet Take by mouth.    PHQ 2/9 Scores 04/15/2018 01/28/2018 12/04/2016 10/05/2015  PHQ - 2 Score 6 0 0 0  PHQ- 9 Score 13 0 0 -    Physical Exam Vitals signs and nursing note reviewed.  Constitutional:      General: He is irritable.  HENT:     Head: Normocephalic.     Right Ear: External ear normal.     Left Ear: External  ear normal.     Nose: Nose normal.  Eyes:     General: No scleral icterus.       Right eye: No discharge.        Left eye: No discharge.     Conjunctiva/sclera: Conjunctivae normal.     Pupils: Pupils are equal, round, and reactive to light.  Neck:     Musculoskeletal: Normal range of motion and neck supple.     Thyroid: No thyromegaly.     Vascular: No JVD.     Trachea: No tracheal deviation.  Cardiovascular:      Rate and Rhythm: Normal rate and regular rhythm.     Heart sounds: Normal heart sounds. No murmur. No friction rub. No gallop.   Pulmonary:     Effort: No respiratory distress.     Breath sounds: Normal breath sounds. No wheezing or rales.  Abdominal:     General: Bowel sounds are normal.     Palpations: Abdomen is soft. There is no mass.     Tenderness: There is no abdominal tenderness. There is no guarding or rebound.  Musculoskeletal: Normal range of motion.        General: No tenderness.  Lymphadenopathy:     Cervical: No cervical adenopathy.  Skin:    General: Skin is warm.     Findings: No rash.  Neurological:     Mental Status: He is alert and oriented to person, place, and time.     Cranial Nerves: No cranial nerve deficit.     Deep Tendon Reflexes: Reflexes are normal and symmetric.     BP 120/70   Pulse 84   Ht 5\' 6"  (1.676 m)   Wt 189 lb (85.7 kg)   BMI 30.51 kg/m   Assessment and Plan: 1. Current moderate episode of major depressive disorder, unspecified whether recurrent [facility]) Patient has a current major depressive disorder stemming from an incident that occurred on the job.  Patient was referred to [facility] and was given clonazepam 1 mg twice daily as needed for 10 days caution use around heavy machinery.  Patient was started on Zoloft 25 mg for 3 days then progressing to 1 a day thereafter. - Ambulatory referral to Psychiatry - clonazePAM (KLONOPIN) 1 MG tablet; Take 1 tablet (1 mg total) by mouth 2 (two) times daily as needed for anxiety.  Dispense: 20 tablet; Refill: 1  2. Anxiety Patient was referred to psychiatry for extreme anxiety borderline panic disorder for which Klonopin was prescribed.  This will be discussed with psychiatry and information given for referral.  Sertraline milligrams with a 25 mg taper initiated. - Ambulatory referral to Psychiatry - clonazePAM (KLONOPIN) 1 MG tablet; Take 1 tablet (1 mg total) by mouth 2 (two) times  daily as needed for anxiety.  Dispense: 20 tablet; Refill: 1

## 2018-04-19 ENCOUNTER — Ambulatory Visit (INDEPENDENT_AMBULATORY_CARE_PROVIDER_SITE_OTHER): Payer: BLUE CROSS/BLUE SHIELD | Admitting: Family Medicine

## 2018-04-19 ENCOUNTER — Telehealth: Payer: Self-pay | Admitting: Family Medicine

## 2018-04-19 ENCOUNTER — Other Ambulatory Visit
Admission: RE | Admit: 2018-04-19 | Discharge: 2018-04-19 | Disposition: A | Discharge status: Home / Self Care | Payer: BLUE CROSS/BLUE SHIELD | Attending: Family Medicine | Admitting: Family Medicine

## 2018-04-19 ENCOUNTER — Encounter: Payer: Self-pay | Admitting: Family Medicine

## 2018-04-19 VITALS — BP 130/70 | HR 80 | Ht 66.0 in | Wt 190.0 lb

## 2018-04-19 DIAGNOSIS — F321 Major depressive disorder, single episode, moderate: Secondary | ICD-10-CM

## 2018-04-19 DIAGNOSIS — F419 Anxiety disorder, unspecified: Secondary | ICD-10-CM

## 2018-04-19 DIAGNOSIS — R748 Abnormal levels of other serum enzymes: Secondary | ICD-10-CM | POA: Insufficient documentation

## 2018-04-19 LAB — ALKALINE PHOSPHATASE: ALK PHOS: 139 U/L — AB (ref 38–126)

## 2018-04-19 NOTE — Progress Notes (Signed)
Date:  04/19/2018   Name:  Jesse Moody   DOB:  05/14/1970   MRN:  [id number]   Chief Complaint: Depression (follow up after starting med- PHQ9= 7/ down from 13)  Depression       The patient presents with depression.  This is a new problem.  The current episode started 1 to 4 weeks ago.   The onset quality is sudden.   The problem has been gradually improving since onset.  Associated symptoms include restlessness, decreased interest and sad.  Associated symptoms include no decreased concentration, no fatigue, no helplessness, no hopelessness, does not have insomnia, not irritable, no appetite change, no body aches, no myalgias, no headaches, no indigestion and no suicidal ideas.     The symptoms are aggravated by medication.  Past treatments include SSRIs - Selective serotonin reuptake inhibitors.  Compliance with treatment is variable.  Previous treatment provided moderate relief.  Risk factors: history work concern.   Past medical history includes depression.     Review of Systems  Constitutional: Negative for appetite change, chills, fatigue and fever.  HENT: Negative for drooling, ear discharge, ear pain and sore throat.   Respiratory: Negative for cough, shortness of breath and wheezing.   Cardiovascular: Negative for chest pain, palpitations and leg swelling.  Gastrointestinal: Negative for abdominal pain, blood in stool, constipation, diarrhea and nausea.  Endocrine: Negative for polydipsia.  Genitourinary: Negative for dysuria, frequency, hematuria and urgency.  Musculoskeletal: Negative for back pain, myalgias and neck pain.  Skin: Negative for rash.  Allergic/Immunologic: Negative for environmental allergies.  Neurological: Negative for dizziness and headaches.  Hematological: Does not bruise/bleed easily.  Psychiatric/Behavioral: Positive for depression. Negative for decreased concentration and suicidal ideas. The patient is not nervous/anxious and does not have insomnia.      Patient Active Problem List   Diagnosis Date Noted  . Degenerative cervical disc 09/07/2015  . Essential hypertension 09/07/2015    Allergies  Allergen Reactions  . Penicillins Hives    Past Surgical History:  Procedure Laterality Date  . ELBOW SURGERY    . HERNIA REPAIR  1998    Social History   Tobacco Use  . Smoking status: Current Every Day Smoker  . Smokeless tobacco: Former Network engineer Use Topics  . Alcohol use: No    Alcohol/week: 0.0 standard drinks  . Drug use: No     Medication list has been reviewed and updated.  No outpatient medications have been marked as taking for the 04/19/18 encounter (Office Visit) with Juline Patch, MD.    [facility] 2/9 Scores 04/19/2018 04/15/2018 01/28/2018 12/04/2016  PHQ - 2 Score 5 6 0 0  PHQ- 9 Score 7 13 0 0    Physical Exam Vitals signs and nursing note reviewed.  Constitutional:      General: He is not irritable. HENT:     Head: Normocephalic.     Right Ear: External ear normal.     Left Ear: External ear normal.     Nose: Nose normal.  Eyes:     General: No scleral icterus.       Right eye: No discharge.        Left eye: No discharge.     Conjunctiva/sclera: Conjunctivae normal.     Pupils: Pupils are equal, round, and reactive to light.  Neck:     Musculoskeletal: Normal range of motion and neck supple.     Thyroid: No thyromegaly.     Vascular: No JVD.  Trachea: No tracheal deviation.  Cardiovascular:     Rate and Rhythm: Normal rate and regular rhythm.     Heart sounds: Normal heart sounds. No murmur. No friction rub. No gallop.   Pulmonary:     Effort: No respiratory distress.     Breath sounds: Normal breath sounds. No wheezing or rales.  Abdominal:     General: Bowel sounds are normal.     Palpations: Abdomen is soft. There is no mass.     Tenderness: There is no abdominal tenderness. There is no guarding or rebound.  Musculoskeletal: Normal range of motion.        General: No tenderness.   Lymphadenopathy:     Cervical: No cervical adenopathy.  Skin:    General: Skin is warm.     Findings: No rash.  Neurological:     Mental Status: He is alert and oriented to person, place, and time.     Cranial Nerves: No cranial nerve deficit.     Deep Tendon Reflexes: Reflexes are normal and symmetric.     BP 130/70   Pulse 80   Ht '5\' 6"'  (1.676 m)   Wt 190 lb (86.2 kg)   BMI 30.67 kg/m   Assessment and Plan: 1. Elevated liver enzymes Patient in October had an elevated alk phosphatase of 148.  Patient has since stopped Tylenol and we will recheck his pulse at this time. - Alkaline phosphatase  2. Current moderate episode of major depressive disorder, unspecified whether recurrent ([place]) Patient is tolerating sertraline.  He is currently tapering onto sertraline 50 mg 1 a day.  Patient is to be seeing psychiatry tomorrow and patient will be evaluated and treated.  Patient's PHQ 9 has improved from 13 to 7.  3. Anxiety Patient also has anxiety for which the sertraline is also being given.  Patient was also given Klonopin 1 mg twice daily to use as needed anxiety.  Patient was told that this would not be a long-term and would be to be determined by his psychiatrist.

## 2018-04-19 NOTE — Telephone Encounter (Signed)
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 I also mentioned that he is to see the psychiatrist tomorrow and they can discuss this with him.

## 2018-04-20 ENCOUNTER — Other Ambulatory Visit: Payer: Self-pay

## 2018-04-20 DIAGNOSIS — R748 Abnormal levels of other serum enzymes: Secondary | ICD-10-CM

## 2018-04-20 NOTE — Progress Notes (Unsigned)
Ordered US limited RUQ for elevated liver enzymes/ alk phos

## 2018-04-26 ENCOUNTER — Ambulatory Visit: Payer: BLUE CROSS/BLUE SHIELD

## 2018-04-27 ENCOUNTER — Ambulatory Visit
Admission: RE | Admit: 2018-04-27 | Discharge: 2018-04-27 | Disposition: A | Discharge status: Home / Self Care | Payer: BLUE CROSS/BLUE SHIELD | Source: Ambulatory Visit | Attending: Family Medicine | Admitting: Family Medicine

## 2018-04-27 DIAGNOSIS — R748 Abnormal levels of other serum enzymes: Secondary | ICD-10-CM | POA: Insufficient documentation

## 2018-04-27 DIAGNOSIS — K7689 Other specified diseases of liver: Secondary | ICD-10-CM | POA: Diagnosis not present

## 2018-05-21 DIAGNOSIS — T1490XA Injury, unspecified, initial encounter: Secondary | ICD-10-CM | POA: Diagnosis not present

## 2018-05-21 DIAGNOSIS — S62660A Nondisplaced fracture of distal phalanx of right index finger, initial encounter for closed fracture: Secondary | ICD-10-CM | POA: Diagnosis not present

## 2018-05-21 DIAGNOSIS — S0012XA Contusion of left eyelid and periocular area, initial encounter: Secondary | ICD-10-CM | POA: Diagnosis not present

## 2018-05-21 DIAGNOSIS — S161XXA Strain of muscle, fascia and tendon at neck level, initial encounter: Secondary | ICD-10-CM | POA: Diagnosis not present

## 2018-10-13 DIAGNOSIS — Z1159 Encounter for screening for other viral diseases: Secondary | ICD-10-CM | POA: Diagnosis not present

## 2018-10-29 ENCOUNTER — Other Ambulatory Visit: Payer: Self-pay

## 2018-10-29 DIAGNOSIS — I10 Essential (primary) hypertension: Secondary | ICD-10-CM

## 2018-10-29 DIAGNOSIS — M722 Plantar fascial fibromatosis: Secondary | ICD-10-CM

## 2018-10-29 MED ORDER — MELOXICAM 15 MG PO TABS: 15.0000 mg | ORAL_TABLET | Freq: Every day | ORAL | 0 refills | Status: DC

## 2018-10-29 MED ORDER — LISINOPRIL-HYDROCHLOROTHIAZIDE 10-12.5 MG PO TABS: 1.0000 | ORAL_TABLET | Freq: Every day | ORAL | 0 refills | Status: DC

## 2018-11-05 ENCOUNTER — Other Ambulatory Visit: Payer: Self-pay

## 2018-11-05 ENCOUNTER — Encounter: Payer: Self-pay | Admitting: Family Medicine

## 2018-11-05 ENCOUNTER — Other Ambulatory Visit
Admission: RE | Admit: 2018-11-05 | Discharge: 2018-11-05 | Disposition: A | Discharge status: Home / Self Care | Payer: BC Managed Care – PPO | Attending: Family Medicine | Admitting: Family Medicine

## 2018-11-05 ENCOUNTER — Ambulatory Visit (INDEPENDENT_AMBULATORY_CARE_PROVIDER_SITE_OTHER): Payer: BC Managed Care – PPO | Admitting: Family Medicine

## 2018-11-05 VITALS — BP 136/76 | HR 78 | Ht 66.0 in | Wt 186.0 lb

## 2018-11-05 DIAGNOSIS — M722 Plantar fascial fibromatosis: Secondary | ICD-10-CM | POA: Insufficient documentation

## 2018-11-05 DIAGNOSIS — Z880 Allergy status to penicillin: Secondary | ICD-10-CM | POA: Diagnosis not present

## 2018-11-05 DIAGNOSIS — Z79899 Other long term (current) drug therapy: Secondary | ICD-10-CM | POA: Diagnosis not present

## 2018-11-05 DIAGNOSIS — I10 Essential (primary) hypertension: Secondary | ICD-10-CM | POA: Insufficient documentation

## 2018-11-05 DIAGNOSIS — F17200 Nicotine dependence, unspecified, uncomplicated: Secondary | ICD-10-CM | POA: Diagnosis not present

## 2018-11-05 DIAGNOSIS — Z791 Long term (current) use of non-steroidal anti-inflammatories (NSAID): Secondary | ICD-10-CM | POA: Insufficient documentation

## 2018-11-05 LAB — RENAL FUNCTION PANEL
Albumin: 4.1 g/dL (ref 3.5–5.0)
Anion gap: 8 (ref 5–15)
BUN: 21 mg/dL — ABNORMAL HIGH (ref 6–20)
CO2: 23 mmol/L (ref 22–32)
Calcium: 9.1 mg/dL (ref 8.9–10.3)
Chloride: 104 mmol/L (ref 98–111)
Creatinine, Ser: 0.87 mg/dL (ref 0.61–1.24)
GFR calc Af Amer: >60 mL/min (ref >60)
GFR calc non Af Amer: >60 mL/min (ref >60)
Glucose, Bld: 88 mg/dL (ref 70–99)
Phosphorus: 3.9 mg/dL (ref 2.5–4.6)
Potassium: 4 mmol/L (ref 3.5–5.1)
Sodium: 135 mmol/L (ref 135–145)

## 2018-11-05 MED ORDER — MELOXICAM 15 MG PO TABS: 15.0000 mg | ORAL_TABLET | Freq: Every day | ORAL | 1 refills | Status: DC

## 2018-11-05 MED ORDER — LISINOPRIL-HYDROCHLOROTHIAZIDE 10-12.5 MG PO TABS: 1.0000 | ORAL_TABLET | Freq: Every day | ORAL | 1 refills | Status: DC

## 2018-11-05 NOTE — Progress Notes (Signed)
Date:  11/05/2018   Name:  Jesse Moody   DOB:  1970-12-01   MRN:  [id number]   Chief Complaint: Hypertension and plantar fasciaitis (takes meloxicam for this)  Hypertension This is a chronic problem. The current episode started more than 1 year ago. The problem is unchanged. The problem is controlled. Pertinent negatives include no anxiety, blurred vision, chest pain, headaches, malaise/fatigue, neck pain, orthopnea, palpitations, peripheral edema, PND, shortness of breath or sweats. There are no associated agents to hypertension. There are no known risk factors for coronary artery disease. Past treatments include ACE inhibitors and diuretics. The current treatment provides moderate improvement. There are no compliance problems.  There is no history of angina, kidney disease, CAD/MI, CVA, heart failure, left ventricular hypertrophy, PVD or retinopathy. There is no history of chronic renal disease, a hypertension causing med or renovascular disease.  Leg Pain  The incident occurred more than 1 week ago. There was no injury mechanism. The pain is present in the left foot. The pain is mild. The pain has been fluctuating since onset. The symptoms are aggravated by weight bearing. The treatment provided no relief.    Review of Systems  Constitutional: Negative for chills, fever and malaise/fatigue.  HENT: Negative for drooling, ear discharge, ear pain and sore throat.   Eyes: Negative for blurred vision.  Respiratory: Negative for cough, shortness of breath and wheezing.   Cardiovascular: Negative for chest pain, palpitations, orthopnea, leg swelling and PND.  Gastrointestinal: Negative for abdominal pain, blood in stool, constipation, diarrhea and nausea.  Endocrine: Negative for polydipsia.  Genitourinary: Negative for dysuria, frequency, hematuria and urgency.  Musculoskeletal: Negative for back pain, myalgias and neck pain.  Skin: Negative for rash.  Allergic/Immunologic: Negative for  environmental allergies.  Neurological: Negative for dizziness and headaches.  Hematological: Does not bruise/bleed easily.  Psychiatric/Behavioral: Negative for suicidal ideas. The patient is not nervous/anxious.     Patient Active Problem List   Diagnosis Date Noted  . Degenerative cervical disc 09/07/2015  . Essential hypertension 09/07/2015    Allergies  Allergen Reactions  . Penicillins Hives    Past Surgical History:  Procedure Laterality Date  . ELBOW SURGERY    . HERNIA REPAIR  1998    Social History   Tobacco Use  . Smoking status: Current Every Day Smoker  . Smokeless tobacco: Former Network engineer Use Topics  . Alcohol use: No    Alcohol/week: 0.0 standard drinks  . Drug use: No     Medication list has been reviewed and updated.  Current Meds  Medication Sig  . acetaminophen (TYLENOL) 325 MG tablet Take 650 mg by mouth every 6 (six) hours as needed.  . Famotidine-Ca Carb-Mag Hydrox (ACID REDUCER + ANTACID PO) Take 1 capsule by mouth 2 (two) times daily.  Marland Kitchen lisinopril-hydrochlorothiazide (ZESTORETIC) 10-12.5 MG tablet Take 1 tablet by mouth daily.  . meloxicam (MOBIC) 15 MG tablet Take 1 tablet (15 mg total) by mouth daily.    PHQ 2/9 Scores 11/05/2018 04/19/2018 04/15/2018 01/28/2018  PHQ - 2 Score 0 5 6 0  PHQ- 9 Score 0 7 13 0    BP Readings from Last 3 Encounters:  11/05/18 136/76  04/19/18 130/70  04/15/18 120/70    Physical Exam Vitals signs and nursing note reviewed.  HENT:     Head: Normocephalic.     Right Ear: Tympanic membrane, ear canal and external ear normal.     Left Ear: Tympanic membrane, ear canal and external  ear normal.     Nose: Nose normal.     Mouth/Throat:     Mouth: Mucous membranes are moist.  Eyes:     General: No scleral icterus.       Right eye: No discharge.        Left eye: No discharge.     Conjunctiva/sclera: Conjunctivae normal.     Pupils: Pupils are equal, round, and reactive to light.  Neck:      Musculoskeletal: Normal range of motion and neck supple.     Thyroid: No thyromegaly.     Vascular: No JVD.     Trachea: No tracheal deviation.  Cardiovascular:     Rate and Rhythm: Normal rate and regular rhythm.     Heart sounds: Normal heart sounds. No murmur. No friction rub. No gallop.   Pulmonary:     Effort: No respiratory distress.     Breath sounds: Normal breath sounds. No wheezing or rales.  Abdominal:     General: Bowel sounds are normal.     Palpations: Abdomen is soft. There is no mass.     Tenderness: There is no abdominal tenderness. There is no guarding or rebound.  Musculoskeletal: Normal range of motion.     Left foot: Normal range of motion. Tenderness present. No deformity or bunion.  Feet:     Left foot:     Skin integrity: No ulcer, blister, skin breakdown, erythema, warmth, callus, dry skin or fissure.  Lymphadenopathy:     Cervical: No cervical adenopathy.  Skin:    General: Skin is warm.     Findings: No rash.  Neurological:     Mental Status: He is alert and oriented to person, place, and time.     Cranial Nerves: No cranial nerve deficit.     Deep Tendon Reflexes: Reflexes are normal and symmetric.     Wt Readings from Last 3 Encounters:  11/05/18 186 lb (84.4 kg)  04/19/18 190 lb (86.2 kg)  04/15/18 189 lb (85.7 kg)    BP 136/76   Pulse 78   Ht 5\' 6"  (1.676 m)   Wt 186 lb (84.4 kg)   SpO2 98%   BMI 30.02 kg/m   Assessment and Plan: 1. Plantar fasciitis Patient with history of plantar fasciitis which this is been improving on meloxicam 15 mg once a day that he is now taking on an every other days basis.  We will refill the more Aloxi cam 15 mg once a day and check his GFR to make sure that this is not been adversely affected. - meloxicam (MOBIC) 15 MG tablet; Take 1 tablet (15 mg total) by mouth daily.  Dispense: 90 tablet; Refill: 1  2. Essential hypertension Chronic.  Controlled.  Continue lisinopril hydrochlorothiazide 10-12 0.5.  We  will check renal function panel. - Renal function panel - lisinopril-hydrochlorothiazide (ZESTORETIC) 10-12.5 MG tablet; Take 1 tablet by mouth daily.  Dispense: 90 tablet; Refill: 1

## 2019-10-25 ENCOUNTER — Other Ambulatory Visit: Payer: Self-pay | Admitting: Family Medicine

## 2019-10-25 DIAGNOSIS — I10 Essential (primary) hypertension: Secondary | ICD-10-CM

## 2019-10-25 IMAGING — US US ABDOMEN LIMITED
1 series · 14 of 25 positions shown · non-contrast
Comparison: None.

CLINICAL DATA: 47-year-old male with elevated alkaline phosphatase.
Initial encounter.

EXAM:
ULTRASOUND ABDOMEN LIMITED RIGHT UPPER QUADRANT

[Series 1: us abdomen limited · 0.23mm/px · 14 of 47 slices shown]
[im 1/47]
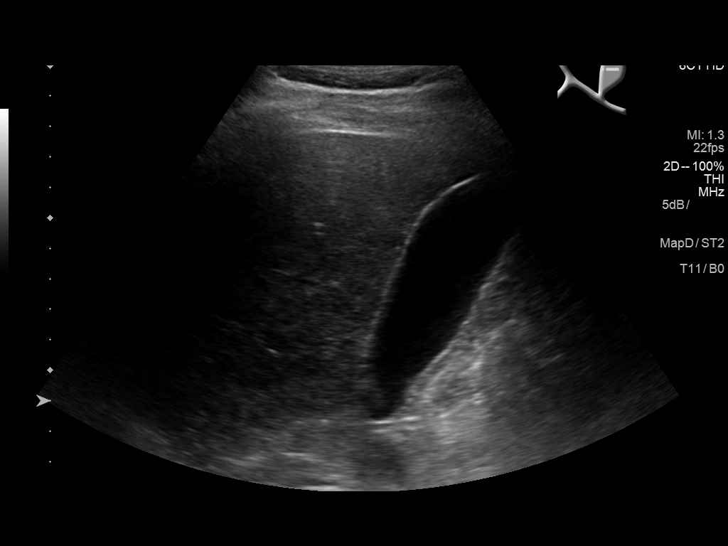
[im 4/47]
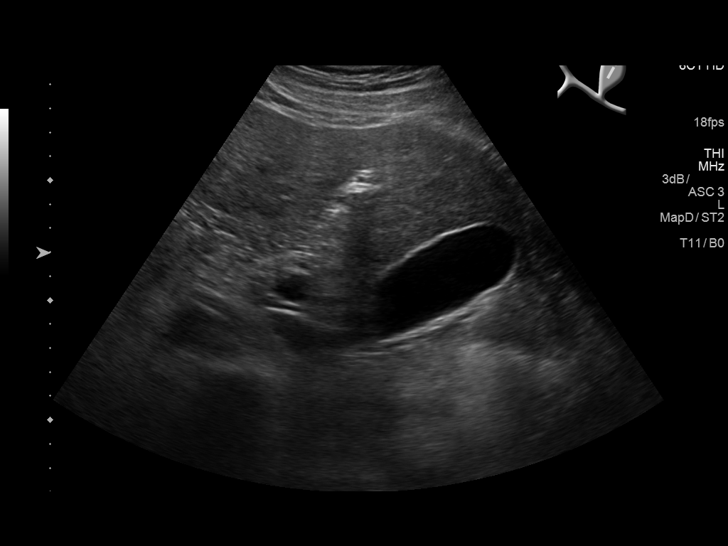
[im 8/47]
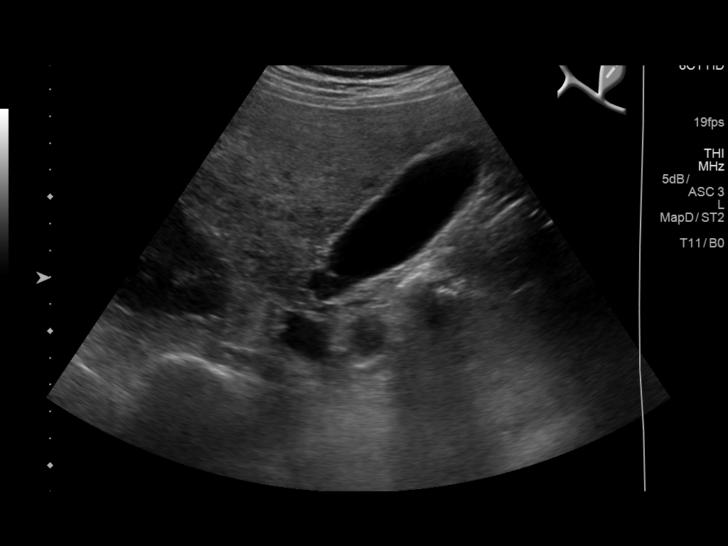
[im 12/47]
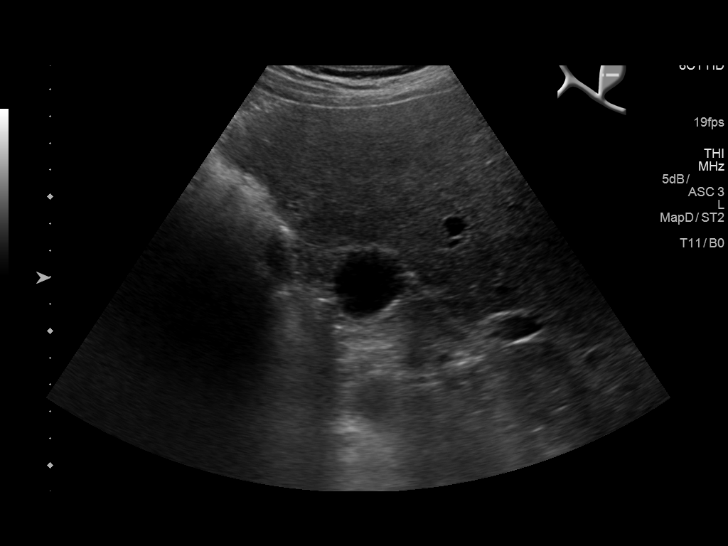
[im 16/47]
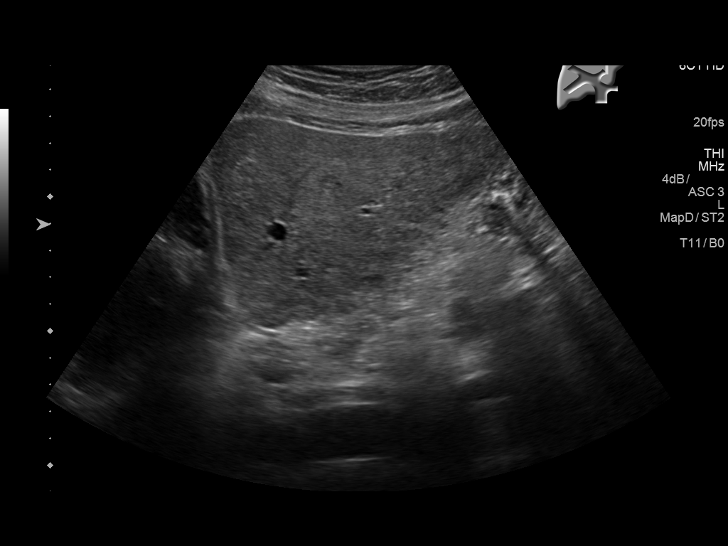
[im 18/47]
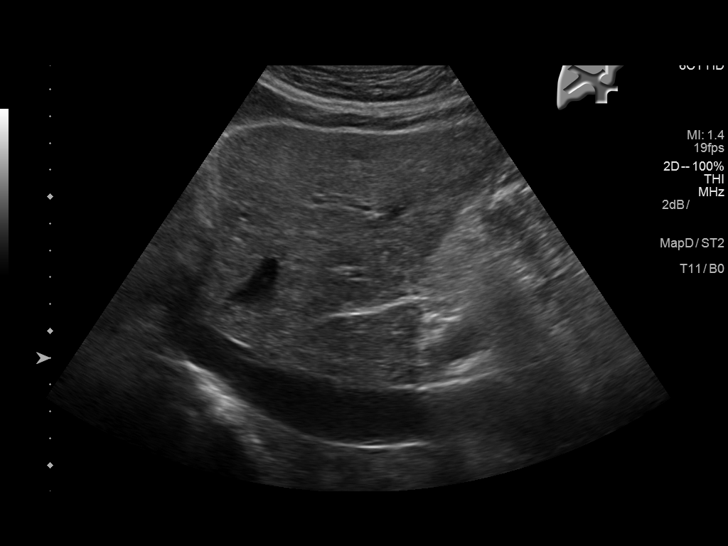
[im 22/47]
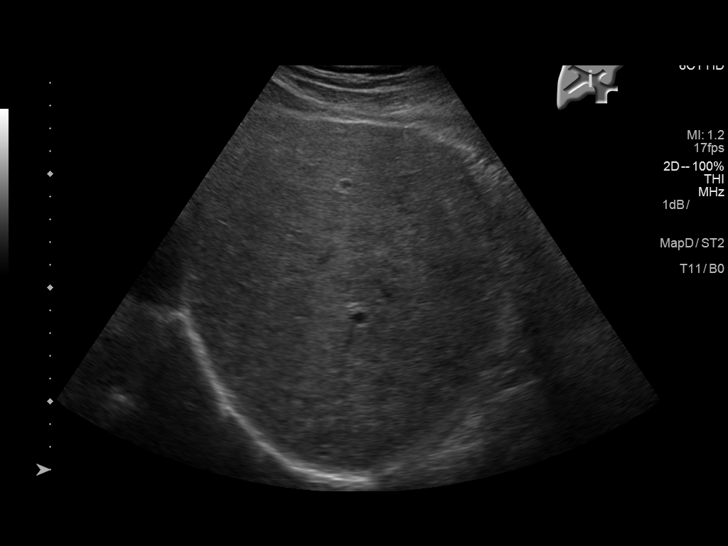
[im 25/47]
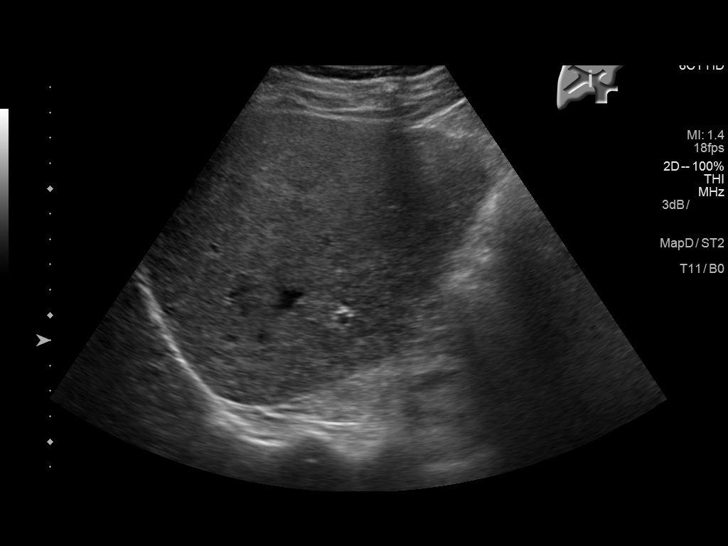
[im 29/47]
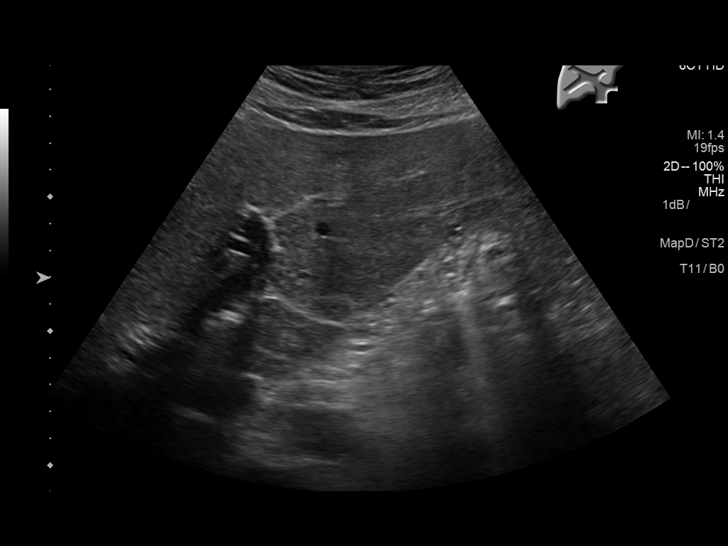
[im 31/47]
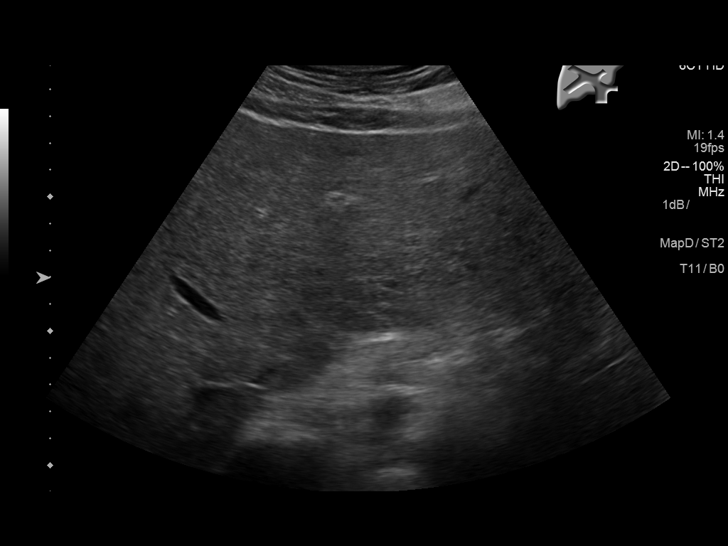
[im 35/47]
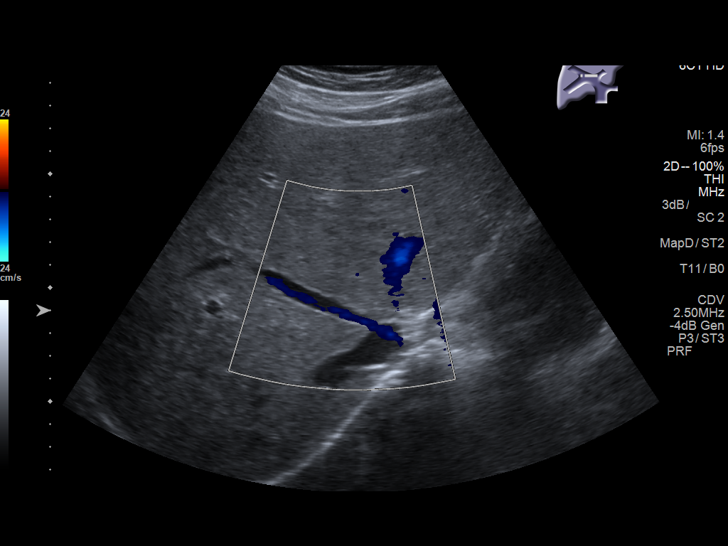
[im 39/47]
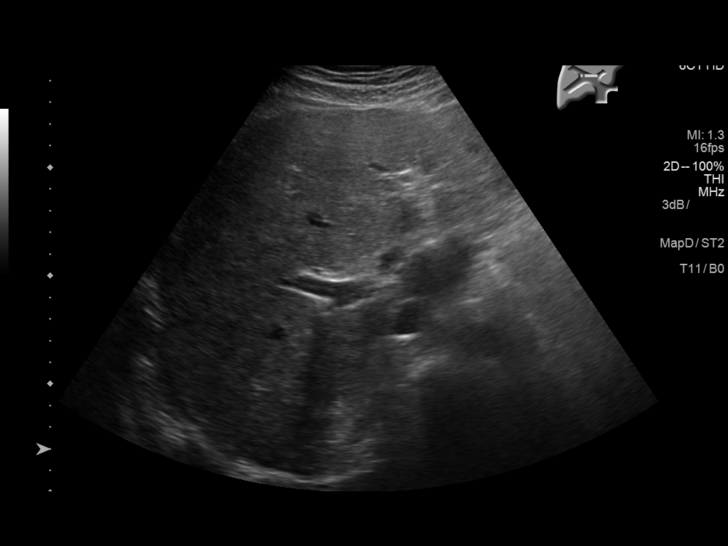
[im 43/47]
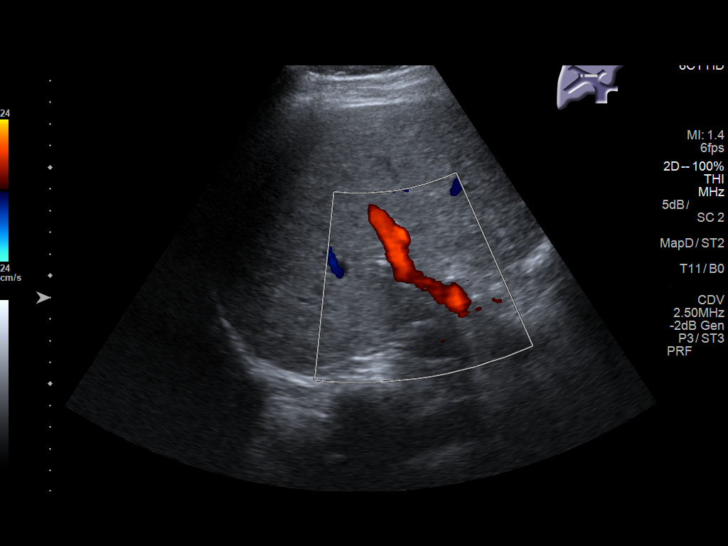
[im 47/47]
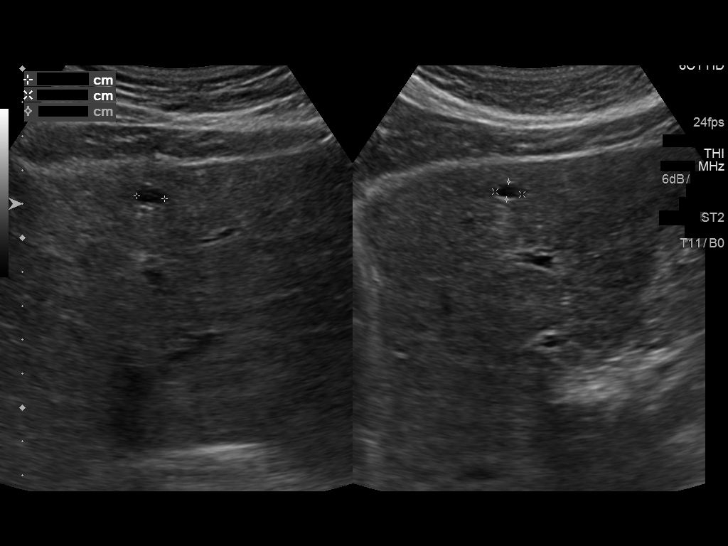

[14 of 25 positions shown; findings below may reference images not displayed]

FINDINGS: Gallbladder:

No gallstones or wall thickening visualized. No sonographic Murphy
sign noted by sonographer.

Common bile duct:

Diameter: 2.3 mm

Liver:

Liver of increased echogenicity consistent with fatty infiltration
and/or hepatocellular disease. Within the right lobe of liver 6 mm
lesions suggestive of a cyst. Within left lobe of liver 8 mm lesion
suggestive of a cyst. Portal vein is patent on color Doppler imaging
with normal direction of blood flow towards the liver.
IMPRESSION: 1. Liver of increased echogenicity consistent with fatty
infiltration and/or hepatocellular disease. Suspect subcentimeter
cysts within right and left lobe of the liver.
2. Otherwise negative right upper quadrant ultrasound.

## 2019-10-25 NOTE — Telephone Encounter (Signed)
Requested medications are due for refill today?  Yes  Requested medications are on active medication list?  Yes  Last Refill:   11/05/2018  # 90 with one refill.    Future visit scheduled?  No   Notes to Clinic:  Medication failed Rx refill protocol due to no labs within the past 180 days.  Last labs were performed on 11/05/2018.

## 2019-11-01 ENCOUNTER — Other Ambulatory Visit: Payer: Self-pay

## 2019-11-01 ENCOUNTER — Ambulatory Visit (INDEPENDENT_AMBULATORY_CARE_PROVIDER_SITE_OTHER): Payer: BC Managed Care – PPO | Admitting: Family Medicine

## 2019-11-01 ENCOUNTER — Encounter: Payer: Self-pay | Admitting: Family Medicine

## 2019-11-01 VITALS — BP 136/90 | HR 88 | Ht 66.0 in | Wt 186.0 lb

## 2019-11-01 DIAGNOSIS — M5412 Radiculopathy, cervical region: Secondary | ICD-10-CM

## 2019-11-01 DIAGNOSIS — I10 Essential (primary) hypertension: Secondary | ICD-10-CM

## 2019-11-01 MED ORDER — LISINOPRIL-HYDROCHLOROTHIAZIDE 10-12.5 MG PO TABS: 1.0000 | ORAL_TABLET | Freq: Every day | ORAL | 1 refills | Status: AC

## 2019-11-01 MED ORDER — MELOXICAM 15 MG PO TABS: 15.0000 mg | ORAL_TABLET | Freq: Every day | ORAL | 1 refills | Status: DC

## 2019-11-01 MED ORDER — PREDNISONE 10 MG PO TABS: 10.0000 mg | ORAL_TABLET | Freq: Every day | ORAL | 0 refills | Status: DC

## 2019-11-01 MED ORDER — CYCLOBENZAPRINE HCL 10 MG PO TABS: 10.0000 mg | ORAL_TABLET | Freq: Every day | ORAL | 2 refills | Status: DC

## 2019-11-01 NOTE — Progress Notes (Signed)
Date:  11/01/2019   Name:  Jesse Moody   DOB:  [birth year]   MRN:  [id number]   Chief Complaint: Hypertension (Refill lisinopril/hctz. Been out of meds for a week. ) and Neck Pain (Refil meloxicam. Wants to discuss trying a muscle relaxer. Neck feels tight on left side of neck and shoulder blade. )  Hypertension This is a chronic problem. The current episode started more than 1 year ago. The problem has been gradually improving since onset. The problem is controlled. Associated symptoms include neck pain. Pertinent negatives include no anxiety, blurred vision, chest pain, headaches, malaise/fatigue, orthopnea, palpitations, peripheral edema, PND, shortness of breath or sweats. There are no associated agents to hypertension. Risk factors for coronary artery disease include male gender, obesity and dyslipidemia. Past treatments include ACE inhibitors and diuretics. The current treatment provides mild improvement. There are no compliance problems.  There is no history of angina, kidney disease, CAD/MI, CVA, heart failure, left ventricular hypertrophy, PVD or retinopathy. There is no history of chronic renal disease, a hypertension causing med or renovascular disease.  Neck Pain  This is a chronic problem. The current episode started more than 1 year ago. The problem occurs intermittently. The problem has been gradually worsening. The pain is associated with nothing. The pain is present in the left side. The quality of the pain is described as burning. The pain is moderate. The symptoms are aggravated by twisting and bending. Stiffness is present in the morning. Pertinent negatives include no chest pain, fever, headaches, leg pain, numbness, pain with swallowing, paresis, photophobia, syncope, tingling, trouble swallowing, visual change, weakness or weight loss. He has tried nothing for the symptoms.    Lab Results  Component Value Date   CREATININE 0.87 11/05/2018   BUN 21 (H) 11/05/2018   NA 135  11/05/2018   K 4.0 11/05/2018   CL 104 11/05/2018   CO2 23 11/05/2018   No results found for: CHOL, HDL, LDLCALC, LDLDIRECT, TRIG, CHOLHDL No results found for: TSH No results found for: HGBA1C Lab Results  Component Value Date   HGB 16.0 08/21/2015   Lab Results  Component Value Date   ALT 28 01/28/2018   AST 24 01/28/2018   ALKPHOS 139 (H) 04/19/2018   BILITOT <0.2 01/28/2018     Review of Systems  Constitutional: Negative for chills, fever, malaise/fatigue and weight loss.  HENT: Negative for drooling, ear discharge, ear pain, sore throat and trouble swallowing.   Eyes: Negative for blurred vision and photophobia.  Respiratory: Negative for cough, shortness of breath and wheezing.   Cardiovascular: Negative for chest pain, palpitations, orthopnea, leg swelling, syncope and PND.  Gastrointestinal: Negative for abdominal pain, blood in stool, constipation, diarrhea and nausea.  Endocrine: Negative for polydipsia.  Genitourinary: Negative for dysuria, frequency, hematuria and urgency.  Musculoskeletal: Positive for neck pain. Negative for back pain and myalgias.  Skin: Negative for rash.  Allergic/Immunologic: Negative for environmental allergies.  Neurological: Negative for dizziness, tingling, weakness, numbness and headaches.  Hematological: Does not bruise/bleed easily.  Psychiatric/Behavioral: Negative for suicidal ideas. The patient is not nervous/anxious.     Patient Active Problem List   Diagnosis Date Noted  . Degenerative cervical disc 09/07/2015  . Essential hypertension 09/07/2015    Allergies  Allergen Reactions  . Penicillins Hives    Past Surgical History:  Procedure Laterality Date  . ELBOW SURGERY    . HERNIA REPAIR  1998    Social History   Tobacco Use  . Smoking  status: Current Every Day Smoker  . Smokeless tobacco: Former Engineer, water Use Topics  . Alcohol use: No    Alcohol/week: 0.0 standard drinks  . Drug use: No      Medication list has been reviewed and updated.  Current Meds  Medication Sig  . acetaminophen (TYLENOL) 325 MG tablet Take 650 mg by mouth every 6 (six) hours as needed.  . Famotidine-Ca Carb-Mag Hydrox (ACID REDUCER + ANTACID PO) Take 1 capsule by mouth 2 (two) times daily.  Marland Kitchen lisinopril-hydrochlorothiazide (ZESTORETIC) 10-12.5 MG tablet Take 1 tablet by mouth daily.  . meloxicam (MOBIC) 15 MG tablet Take 1 tablet (15 mg total) by mouth daily.    PHQ 2/9 Scores 11/01/2019 11/05/2018 04/19/2018 04/15/2018  PHQ - 2 Score 0 0 5 6  PHQ- 9 Score 0 0 7 13    GAD 7 : Generalized Anxiety Score 11/01/2019 04/15/2018  Nervous, Anxious, on Edge 0 3  Control/stop worrying 0 3  Worry too much - different things 0 3  Trouble relaxing 0 2  Restless 0 2  Easily annoyed or irritable 0 1  Afraid - awful might happen 0 0  Total GAD 7 Score 0 14  Anxiety Difficulty Not difficult at all Somewhat difficult    BP Readings from Last 3 Encounters:  11/01/19 136/90  11/05/18 136/76  04/19/18 130/70    Physical Exam Vitals and nursing note reviewed.  HENT:     Head: Normocephalic.     Right Ear: External ear normal.     Left Ear: External ear normal.     Nose: Nose normal. No congestion or rhinorrhea.     Mouth/Throat:     Mouth: Mucous membranes are moist.  Eyes:     General: No scleral icterus.       Right eye: No discharge.        Left eye: No discharge.     Conjunctiva/sclera: Conjunctivae normal.     Pupils: Pupils are equal, round, and reactive to light.  Neck:     Thyroid: No thyromegaly.     Vascular: No JVD.     Trachea: No tracheal deviation.  Cardiovascular:     Rate and Rhythm: Normal rate and regular rhythm.     Heart sounds: Normal heart sounds. No murmur heard.  No friction rub. No gallop.   Pulmonary:     Effort: No respiratory distress.     Breath sounds: Normal breath sounds. No wheezing, rhonchi or rales.  Abdominal:     General: Bowel sounds are normal.      Palpations: Abdomen is soft. There is no mass.     Tenderness: There is no abdominal tenderness. There is no guarding or rebound.  Musculoskeletal:        General: No tenderness. Normal range of motion.     Cervical back: Normal range of motion and neck supple.  Lymphadenopathy:     Cervical: No cervical adenopathy.  Skin:    General: Skin is warm.     Findings: No rash.  Neurological:     Mental Status: He is alert and oriented to person, place, and time.     Cranial Nerves: No cranial nerve deficit.     Deep Tendon Reflexes: Reflexes are normal and symmetric.     Wt Readings from Last 3 Encounters:  11/01/19 186 lb (84.4 kg)  11/05/18 186 lb (84.4 kg)  04/19/18 190 lb (86.2 kg)    BP 136/90   Pulse 88  Ht 5\' 6"  (1.676 m)   Wt 186 lb (84.4 kg)   SpO2 96%   BMI 30.02 kg/m   Assessment and Plan: 1. Essential hypertension Chronic.  Controlled.  Stable.  Continue lisinopril hydrochlorothiazide 10-12.5 mg one a day.  Check previous labs and we will hold until next visit in 6 months. - lisinopril-hydrochlorothiazide (ZESTORETIC) 10-12.5 MG tablet; Take 1 tablet by mouth daily.  Dispense: 90 tablet; Refill: 1  2. Cervical radiculitis Recurrent.  Repeat episode of a similar nature.  Relatively stable but with pain radiating to the left scapula.  Patient has had this treated earlier with orthopedics/may be neurosurgery in [place].  We will resume patient's meloxicam 15 mg once a day.  We will also give a prednisone taper starting at 40 mg over a 12-day.  And provide cyclobenzaprine one nightly for muscle relaxation and to assist with sleep.  Patient is to return if recurrence or unresolved next step will be orthopedic referral for formal evaluation and possible injection. - meloxicam (MOBIC) 15 MG tablet; Take 1 tablet (15 mg total) by mouth daily.  Dispense: 90 tablet; Refill: 1 - predniSONE (DELTASONE) 10 MG tablet; Take 1 tablet (10 mg total) by mouth daily with breakfast. Taper  4,4,4,3,3,3,2,2,2,1,1,1  Dispense: 30 tablet; Refill: 0 - cyclobenzaprine (FLEXERIL) 10 MG tablet; Take 1 tablet (10 mg total) by mouth at bedtime.  Dispense: 30 tablet; Refill: 2

## 2020-03-12 DIAGNOSIS — M549 Dorsalgia, unspecified: Secondary | ICD-10-CM | POA: Diagnosis not present

## 2020-03-12 DIAGNOSIS — I10 Essential (primary) hypertension: Secondary | ICD-10-CM | POA: Diagnosis not present

## 2020-03-12 DIAGNOSIS — Z23 Encounter for immunization: Secondary | ICD-10-CM | POA: Diagnosis not present

## 2020-03-12 DIAGNOSIS — Z1389 Encounter for screening for other disorder: Secondary | ICD-10-CM | POA: Diagnosis not present

## 2020-03-12 DIAGNOSIS — F17200 Nicotine dependence, unspecified, uncomplicated: Secondary | ICD-10-CM | POA: Diagnosis not present

## 2020-03-21 ENCOUNTER — Telehealth: Payer: BC Managed Care – PPO

## 2020-04-17 ENCOUNTER — Ambulatory Visit: Payer: BC Managed Care – PPO | Admitting: Family Medicine

## 2020-06-06 DIAGNOSIS — R1031 Right lower quadrant pain: Secondary | ICD-10-CM | POA: Diagnosis not present

## 2020-08-14 DIAGNOSIS — I10 Essential (primary) hypertension: Secondary | ICD-10-CM | POA: Diagnosis not present

## 2020-08-14 DIAGNOSIS — N200 Calculus of kidney: Secondary | ICD-10-CM | POA: Diagnosis not present

## 2020-08-14 DIAGNOSIS — Z1211 Encounter for screening for malignant neoplasm of colon: Secondary | ICD-10-CM | POA: Diagnosis not present

## 2020-08-14 DIAGNOSIS — Z1389 Encounter for screening for other disorder: Secondary | ICD-10-CM | POA: Diagnosis not present

## 2020-08-14 DIAGNOSIS — M545 Low back pain, unspecified: Secondary | ICD-10-CM | POA: Diagnosis not present

## 2023-05-15 ENCOUNTER — Encounter (HOSPITAL_COMMUNITY): Payer: Self-pay

## 2023-05-15 ENCOUNTER — Emergency Department (HOSPITAL_COMMUNITY)
Admission: EM | Admit: 2023-05-15 | Discharge: 2023-05-15 | Disposition: A | Discharge status: Home / Self Care | Payer: Self-pay | Attending: Emergency Medicine | Admitting: Emergency Medicine

## 2023-05-15 ENCOUNTER — Emergency Department (HOSPITAL_COMMUNITY): Payer: Self-pay

## 2023-05-15 ENCOUNTER — Other Ambulatory Visit: Payer: Self-pay

## 2023-05-15 DIAGNOSIS — Z79899 Other long term (current) drug therapy: Secondary | ICD-10-CM | POA: Diagnosis not present

## 2023-05-15 DIAGNOSIS — R319 Hematuria, unspecified: Secondary | ICD-10-CM

## 2023-05-15 DIAGNOSIS — R1031 Right lower quadrant pain: Secondary | ICD-10-CM | POA: Diagnosis present

## 2023-05-15 DIAGNOSIS — N132 Hydronephrosis with renal and ureteral calculous obstruction: Secondary | ICD-10-CM | POA: Diagnosis not present

## 2023-05-15 DIAGNOSIS — N201 Calculus of ureter: Secondary | ICD-10-CM

## 2023-05-15 LAB — URINALYSIS, ROUTINE W REFLEX MICROSCOPIC
Bacteria, UA: NONE SEEN
Bilirubin Urine: NEGATIVE
Glucose, UA: NEGATIVE mg/dL
Ketones, ur: NEGATIVE mg/dL
Leukocytes,Ua: NEGATIVE
Nitrite: NEGATIVE
Protein, ur: NEGATIVE mg/dL
RBC / HPF: >50 RBC/hpf (ref 0–5)
Specific Gravity, Urine: 1.011 (ref 1.005–1.030)
pH: 5 (ref 5.0–8.0)

## 2023-05-15 LAB — COMPREHENSIVE METABOLIC PANEL
ALT: 41 U/L (ref 0–44)
AST: 26 U/L (ref 15–41)
Albumin: 3.9 g/dL (ref 3.5–5.0)
Alkaline Phosphatase: 144 U/L — ABNORMAL HIGH (ref 38–126)
Anion gap: 9 (ref 5–15)
BUN: 17 mg/dL (ref 6–20)
CO2: 25 mmol/L (ref 22–32)
Calcium: 9.3 mg/dL (ref 8.9–10.3)
Chloride: 101 mmol/L (ref 98–111)
Creatinine, Ser: 0.99 mg/dL (ref 0.61–1.24)
GFR, Estimated: >60 mL/min (ref >60)
Glucose, Bld: 116 mg/dL — ABNORMAL HIGH (ref 70–99)
Potassium: 4.3 mmol/L (ref 3.5–5.1)
Sodium: 135 mmol/L (ref 135–145)
Total Bilirubin: 0.6 mg/dL (ref 0.0–1.2)
Total Protein: 6.8 g/dL (ref 6.5–8.1)

## 2023-05-15 LAB — CBC
HCT: 53.1 % — ABNORMAL HIGH (ref 39.0–52.0)
Hemoglobin: 17.9 g/dL — ABNORMAL HIGH (ref 13.0–17.0)
MCH: 29.9 pg (ref 26.0–34.0)
MCHC: 33.7 g/dL (ref 30.0–36.0)
MCV: 88.6 fL (ref 80.0–100.0)
Platelets: 255 10*3/uL (ref 150–400)
RBC: 5.99 MIL/uL — ABNORMAL HIGH (ref 4.22–5.81)
RDW: 13.6 % (ref 11.5–15.5)
WBC: 12 10*3/uL — ABNORMAL HIGH (ref 4.0–10.5)
nRBC: 0 % (ref 0.0–0.2)

## 2023-05-15 LAB — LIPASE, BLOOD: Lipase: 35 U/L (ref 11–51)

## 2023-05-15 MED ORDER — OXYCODONE-ACETAMINOPHEN 5-325 MG PO TABS: 1.0000 | ORAL_TABLET | Freq: Four times a day (QID) | ORAL | 0 refills | Status: AC | PRN

## 2023-05-15 MED ORDER — OXYCODONE-ACETAMINOPHEN 5-325 MG PO TABS
2.0000 | ORAL_TABLET | Freq: Once | ORAL | Status: AC
  Administered 2023-05-15: 2 via ORAL
  Filled 2023-05-15: qty 2

## 2023-05-15 MED ORDER — ONDANSETRON HCL 4 MG PO TABS
4.0000 mg | ORAL_TABLET | Freq: Four times a day (QID) | ORAL | 0 refills | Status: AC
Start: 2023-05-15 — End: ?

## 2023-05-15 NOTE — ED Provider Notes (Signed)
[place] EMERGENCY DEPARTMENT AT [facility] Provider Note   CSN: [id number] Arrival date & time: 05/15/23  0744     History  Chief Complaint  Patient presents with   Abdominal Pain    Jesse Moody. is a 53 y.o. male.  HPI 53 year-old male presents today complaining of right groin pain that began this morning.  Patient states he was in his normal state of health when he went to bed last night.  The pain in the right groin began sometime this morning.  He went to work but continued to have pain.  He presents secondary to this now.  He reports a history of kidney stones and it is somewhat similar to prior kidney stone pain.  He has not noted any gross hematuria.  He has not had any nausea or vomiting.    Home Medications Prior to Admission medications   Medication Sig Start Date End Date Taking? Authorizing Provider  acetaminophen (TYLENOL) 325 MG tablet Take 650 mg by mouth every 6 (six) hours as needed for mild pain (pain score 1-3).   Yes [provider]  Famotidine-Ca Carb-Mag Hydrox (ACID REDUCER + ANTACID PO) Take 1 capsule by mouth 2 (two) times daily.   Yes [provider]  lisinopril-hydrochlorothiazide (ZESTORETIC) 10-12.5 MG tablet Take 1 tablet by mouth daily. 11/01/19  Yes Duanne Limerick, MD  oxyCODONE-acetaminophen (PERCOCET/ROXICET) 5-325 MG tablet Take 1 tablet by mouth every 6 (six) hours as needed for severe pain (pain score 7-10). 05/15/23  Yes Margarita Grizzle, MD      Allergies    Penicillins    Review of Systems   Review of Systems  Physical Exam Updated Vital Signs BP 128/87 (BP Location: Left Arm)   Pulse 80   Temp 97.7 F (36.5 C) (Oral)   Resp 18   Ht 1.6 m (5\' 3" )   Wt 81.6 kg   SpO2 92%   BMI 31.89 kg/m  Physical Exam Vitals and nursing note reviewed.  Constitutional:      Appearance: He is well-developed.  HENT:     Head: Normocephalic and atraumatic.     Right Ear: External ear normal.     Left Ear:  External ear normal.     Nose: Nose normal.  Eyes:     Extraocular Movements: Extraocular movements intact.  Neck:     Trachea: No tracheal deviation.  Pulmonary:     Effort: Pulmonary effort is normal.  Abdominal:     General: Abdomen is flat. Bowel sounds are normal.     Palpations: Abdomen is soft.     Hernia: No hernia is present.  Musculoskeletal:        General: Normal range of motion.  Skin:    General: Skin is warm and dry.  Neurological:     Mental Status: He is alert and oriented to person, place, and time.  Psychiatric:        Mood and Affect: Mood normal.        Behavior: Behavior normal.     ED Results / Procedures / Treatments   Labs (all labs ordered are listed, but only abnormal results are displayed) Labs Reviewed  COMPREHENSIVE METABOLIC PANEL - Abnormal; Notable for the following components:      Result Value   Glucose, Bld 116 (*)    Alkaline Phosphatase 144 (*)    All other components within normal limits  CBC - Abnormal; Notable for the following components:   WBC  12.0 (*)    RBC 5.99 (*)    Hemoglobin 17.9 (*)    HCT 53.1 (*)    All other components within normal limits  URINALYSIS, ROUTINE W REFLEX MICROSCOPIC - Abnormal; Notable for the following components:   Hgb urine dipstick MODERATE (*)    All other components within normal limits  LIPASE, BLOOD    EKG None  Radiology No results found.  Procedures Procedures    Medications Ordered in ED Medications  oxyCODONE-acetaminophen (PERCOCET/ROXICET) 5-325 MG per tablet 2 tablet (2 tablets Oral Given 05/15/23 1424)    ED Course/ Medical Decision Making/ A&P                                 Medical Decision Making Amount and/or Complexity of Data Reviewed Labs: ordered. Radiology: ordered.  Risk Prescription drug management.  53 year old male presents today with onset of right groin pain this morning.  Symptoms have decreased and pain is now radiating to the right low back  area. Differential diagnosis includes but is not limited to appendicitis, UTI, renal colic, prostatitis, small bowel obstruction, diverticulitis. Patient evaluated with labs and has greater than 50 red blood cells.  He does have history of kidney stone in the past.  Feel that renal colic is most likely diagnosis.  CT scan is pending.  Patient's pain is minimal and he does not want anything IV or IM.  He is given p.o. Percocet. Care discussed with Dr. Rhae Hammock who has assumed his care pending CT scan.        Final Clinical Impression(s) / ED Diagnoses Final diagnoses:  Pain, abdominal, RLQ  Hematuria, unspecified type    Rx / DC Orders ED Discharge Orders          Ordered    oxyCODONE-acetaminophen (PERCOCET/ROXICET) 5-325 MG tablet  Every 6 hours PRN        05/15/23 1524              Margarita Grizzle, MD 05/15/23 1535

## 2023-05-15 NOTE — ED Triage Notes (Signed)
Pt c/o right lower quadrant pain that started this morning. Pt denies nausea, vomiting, diarrhea, or constipation.

## 2023-05-15 NOTE — Discharge Instructions (Signed)
Please take ibuprofen at home as needed for pain.  Take the Zofran as needed for nausea.  If you are still having pain take the Percocet.  Do not drive or drink alcohol while taking this as may make you drowsy.  Please follow-up with the urologist at the number provided.  Please follow-up with your doctor regarding the cysts on the liver to see if any further workup is needed as an outpatient.  Return to the ER for worsening symptoms.

## 2023-05-15 NOTE — ED Provider Notes (Signed)
53 year old male presenting to the emergency department today with right lower quadrant and flank pain.  The patient did have some hematuria on initial workup.  CT scan is pending at the time of signout.  This to evaluate for appendicitis versus ureterolithiasis.  The patient does have his pain well-controlled with oral medications here.  The plan is to reevaluate for ultimate disposition after imaging studies are obtained.  Physical Exam  BP 128/87 (BP Location: Left Arm)   Pulse 80   Temp 97.7 F (36.5 C) (Oral)   Resp 18   Ht 5\' 3"  (1.6 m)   Wt 81.6 kg   SpO2 92%   BMI 31.89 kg/m   Physical Exam General: No acute distress  Procedures  Procedures  ED Course / MDM    Medical Decision Making Amount and/or Complexity of Data Reviewed Labs: ordered. Radiology: ordered.  Risk Prescription drug management.   The patient CT scan does show a ureteral stone causing his symptoms.  His symptoms are under control here.  His urinalysis is not consistent with infection at this time.  He is requesting discharge.  The patient is discharged with return precautions.  I did discuss the liver lesions.  He states that he did have an ultrasound a few months ago and will follow-up with his primary care provider regarding this.  He is discharged with return precautions.       Durwin Glaze, MD 05/15/23 [id number]
